# Patient Record
Sex: Female | Born: 1985 | Race: White | Hispanic: No | Marital: Married | Smoking: Never smoker
Health system: Southern US, Community
[De-identification: ages and names within clinical notes are randomized; demographics above are authoritative.]

## PROBLEM LIST (undated history)

## (undated) ENCOUNTER — Inpatient Hospital Stay (HOSPITAL_COMMUNITY): Payer: Self-pay

## (undated) DIAGNOSIS — K5190 Ulcerative colitis, unspecified, without complications: Secondary | ICD-10-CM

## (undated) DIAGNOSIS — O139 Gestational [pregnancy-induced] hypertension without significant proteinuria, unspecified trimester: Secondary | ICD-10-CM

## (undated) DIAGNOSIS — R002 Palpitations: Secondary | ICD-10-CM

## (undated) DIAGNOSIS — F418 Other specified anxiety disorders: Secondary | ICD-10-CM

## (undated) DIAGNOSIS — C801 Malignant (primary) neoplasm, unspecified: Secondary | ICD-10-CM

## (undated) DIAGNOSIS — K219 Gastro-esophageal reflux disease without esophagitis: Secondary | ICD-10-CM

## (undated) HISTORY — DX: Palpitations: R00.2

## (undated) HISTORY — DX: Gestational (pregnancy-induced) hypertension without significant proteinuria, unspecified trimester: O13.9

## (undated) HISTORY — PX: BREAST SURGERY: SHX581

## (undated) HISTORY — PX: BREAST ENHANCEMENT SURGERY: SHX7

## (undated) HISTORY — DX: Ulcerative colitis, unspecified, without complications: K51.90

## (undated) HISTORY — PX: APPENDECTOMY: SHX54

## (undated) HISTORY — DX: Other specified anxiety disorders: F41.8

---

## 2006-02-19 ENCOUNTER — Emergency Department (HOSPITAL_COMMUNITY): Admission: EM | Admit: 2006-02-19 | Discharge: 2006-02-20 | Payer: Self-pay | Admitting: Emergency Medicine

## 2006-04-29 ENCOUNTER — Emergency Department (HOSPITAL_COMMUNITY): Admission: EM | Admit: 2006-04-29 | Discharge: 2006-04-29 | Payer: Self-pay | Admitting: Emergency Medicine

## 2007-10-10 ENCOUNTER — Emergency Department (HOSPITAL_COMMUNITY): Admission: EM | Admit: 2007-10-10 | Discharge: 2007-10-10 | Payer: Self-pay | Admitting: Emergency Medicine

## 2008-02-20 HISTORY — PX: WISDOM TOOTH EXTRACTION: SHX21

## 2009-04-08 ENCOUNTER — Ambulatory Visit: Payer: Self-pay | Admitting: Internal Medicine

## 2009-07-23 ENCOUNTER — Emergency Department (HOSPITAL_COMMUNITY): Admission: EM | Admit: 2009-07-23 | Discharge: 2009-07-23 | Payer: Self-pay | Admitting: Emergency Medicine

## 2011-03-05 ENCOUNTER — Ambulatory Visit (HOSPITAL_COMMUNITY)
Admission: AD | Admit: 2011-03-05 | Discharge: 2011-03-05 | Disposition: A | Discharge status: Home / Self Care | Payer: 59 | Source: Ambulatory Visit | Attending: Obstetrics and Gynecology | Admitting: Obstetrics and Gynecology

## 2012-05-02 ENCOUNTER — Observation Stay (HOSPITAL_COMMUNITY)
Admission: EM | Admit: 2012-05-02 | Discharge: 2012-05-04 | Disposition: A | Discharge status: Home / Self Care | Payer: 59 | Attending: General Surgery | Admitting: General Surgery

## 2012-05-02 ENCOUNTER — Encounter (HOSPITAL_COMMUNITY): Payer: Self-pay | Admitting: *Deleted

## 2012-05-02 DIAGNOSIS — K3580 Unspecified acute appendicitis: Principal | ICD-10-CM | POA: Insufficient documentation

## 2012-05-02 LAB — URINALYSIS, ROUTINE W REFLEX MICROSCOPIC
Bilirubin Urine: NEGATIVE
Hgb urine dipstick: NEGATIVE
Specific Gravity, Urine: 1.01 (ref 1.005–1.030)
pH: 5.5 (ref 5.0–8.0)

## 2012-05-02 LAB — BASIC METABOLIC PANEL
BUN: 8 mg/dL (ref 6–23)
Chloride: 99 mEq/L (ref 96–112)
Glucose, Bld: 90 mg/dL (ref 70–99)
Potassium: 4.1 mEq/L (ref 3.5–5.1)

## 2012-05-02 LAB — CBC WITH DIFFERENTIAL/PLATELET
Basophils Relative: 0 % (ref 0–1)
HCT: 40.6 % (ref 36.0–46.0)
Hemoglobin: 13.5 g/dL (ref 12.0–15.0)
MCH: 27 pg (ref 26.0–34.0)
MCHC: 33.3 g/dL (ref 30.0–36.0)
Monocytes Absolute: 1.3 10*3/uL — ABNORMAL HIGH (ref 0.1–1.0)
Monocytes Relative: 7 % (ref 3–12)
Neutro Abs: 14 10*3/uL — ABNORMAL HIGH (ref 1.7–7.7)

## 2012-05-02 LAB — URINE MICROSCOPIC-ADD ON

## 2012-05-02 MED ORDER — IOHEXOL 300 MG/ML  SOLN: 50.0000 mL | Freq: Once | INTRAMUSCULAR | Status: AC | PRN

## 2012-05-02 NOTE — ED Notes (Signed)
Pt given heat pack to place on abdomen.

## 2012-05-02 NOTE — ED Notes (Signed)
C/o RLQ pain, constant, worse with movement, onset this am 1030, some intermittant chills, dizziness and nausea, (denies: vd, fever, bleeding, back pain, urinary or vaginal sx). Last void 4 hrs ago, last ate 1200 noon, last BM 2 d ago (normal), LMP 1.5 wks ago, "no chance of pregnancy". Tried tylenol 500 mg at 1900 and pepto bismol at 1600 w/o relief.

## 2012-05-02 NOTE — ED Notes (Signed)
Pt currently denies nausea, states she is still having dull pain in the RLQ.

## 2012-05-03 ENCOUNTER — Emergency Department (HOSPITAL_COMMUNITY): Payer: 59 | Admitting: Anesthesiology

## 2012-05-03 ENCOUNTER — Encounter (HOSPITAL_COMMUNITY): Payer: Self-pay | Admitting: *Deleted

## 2012-05-03 ENCOUNTER — Encounter (HOSPITAL_COMMUNITY)
Admission: EM | Disposition: A | Discharge status: Home / Self Care | Payer: Self-pay | Source: Home / Self Care | Attending: Emergency Medicine

## 2012-05-03 ENCOUNTER — Emergency Department (HOSPITAL_COMMUNITY): Payer: 59

## 2012-05-03 ENCOUNTER — Encounter (HOSPITAL_COMMUNITY): Payer: Self-pay | Admitting: Anesthesiology

## 2012-05-03 ENCOUNTER — Encounter (HOSPITAL_COMMUNITY): Payer: Self-pay | Admitting: General Surgery

## 2012-05-03 DIAGNOSIS — K3580 Unspecified acute appendicitis: Secondary | ICD-10-CM

## 2012-05-03 HISTORY — PX: LAPAROSCOPIC APPENDECTOMY: SHX408

## 2012-05-03 SURGERY — APPENDECTOMY, LAPAROSCOPIC
Anesthesia: General | Site: Abdomen | Wound class: Contaminated

## 2012-05-03 MED ORDER — SODIUM CHLORIDE 0.9 % IR SOLN
Status: DC | PRN
  Administered 2012-05-03: 1

## 2012-05-03 MED ORDER — METRONIDAZOLE IN NACL 5-0.79 MG/ML-% IV SOLN
INTRAVENOUS | Status: DC | PRN
  Administered 2012-05-03: 500 mg via INTRAVENOUS

## 2012-05-03 MED ORDER — KETOROLAC TROMETHAMINE 30 MG/ML IJ SOLN
30.0000 mg | Freq: Once | INTRAMUSCULAR | Status: AC
  Administered 2012-05-03: 30 mg via INTRAVENOUS

## 2012-05-03 MED ORDER — GLYCOPYRROLATE 0.2 MG/ML IJ SOLN
INTRAMUSCULAR | Status: DC | PRN
  Administered 2012-05-03: 0.4 mg via INTRAVENOUS

## 2012-05-03 MED ORDER — KETOROLAC TROMETHAMINE 15 MG/ML IJ SOLN
15.0000 mg | Freq: Four times a day (QID) | INTRAMUSCULAR | Status: DC
  Administered 2012-05-03 – 2012-05-04 (×5): 15 mg via INTRAVENOUS
  Filled 2012-05-03 (×8): qty 1

## 2012-05-03 MED ORDER — HYDROMORPHONE HCL PF 1 MG/ML IJ SOLN
INTRAMUSCULAR | Status: AC
  Filled 2012-05-03: qty 1

## 2012-05-03 MED ORDER — ACETAMINOPHEN 325 MG PO TABS: 650.0000 mg | ORAL_TABLET | ORAL | Status: DC | PRN

## 2012-05-03 MED ORDER — ENOXAPARIN SODIUM 40 MG/0.4ML ~~LOC~~ SOLN
40.0000 mg | SUBCUTANEOUS | Status: DC
  Administered 2012-05-03: 40 mg via SUBCUTANEOUS
  Filled 2012-05-03 (×2): qty 0.4

## 2012-05-03 MED ORDER — HYDROMORPHONE HCL PF 1 MG/ML IJ SOLN
0.2500 mg | INTRAMUSCULAR | Status: DC | PRN
  Administered 2012-05-03 (×4): 0.5 mg via INTRAVENOUS

## 2012-05-03 MED ORDER — OXYCODONE-ACETAMINOPHEN 5-325 MG PO TABS: 1.0000 | ORAL_TABLET | ORAL | Status: DC | PRN

## 2012-05-03 MED ORDER — METOCLOPRAMIDE HCL 5 MG/ML IJ SOLN
INTRAMUSCULAR | Status: DC | PRN
  Administered 2012-05-03: 10 mg via INTRAVENOUS

## 2012-05-03 MED ORDER — ONDANSETRON HCL 4 MG PO TABS: 4.0000 mg | ORAL_TABLET | Freq: Four times a day (QID) | ORAL | Status: DC | PRN

## 2012-05-03 MED ORDER — BUPIVACAINE-EPINEPHRINE 0.25% -1:200000 IJ SOLN: INTRAMUSCULAR | Status: DC | PRN

## 2012-05-03 MED ORDER — BUPIVACAINE HCL 0.25 % IJ SOLN
INTRAMUSCULAR | Status: DC | PRN
  Administered 2012-05-03: 18 mL

## 2012-05-03 MED ORDER — CIPROFLOXACIN IN D5W 400 MG/200ML IV SOLN
400.0000 mg | Freq: Two times a day (BID) | INTRAVENOUS | Status: AC
  Administered 2012-05-03: 400 mg via INTRAVENOUS
  Filled 2012-05-03: qty 200

## 2012-05-03 MED ORDER — ARTIFICIAL TEARS OP OINT
TOPICAL_OINTMENT | OPHTHALMIC | Status: DC | PRN
  Administered 2012-05-03: 1 via OPHTHALMIC

## 2012-05-03 MED ORDER — NEOSTIGMINE METHYLSULFATE 1 MG/ML IJ SOLN
INTRAMUSCULAR | Status: DC | PRN
  Administered 2012-05-03: 4 mg via INTRAVENOUS

## 2012-05-03 MED ORDER — DEXAMETHASONE SODIUM PHOSPHATE 4 MG/ML IJ SOLN
INTRAMUSCULAR | Status: DC | PRN
  Administered 2012-05-03: 8 mg via INTRAVENOUS

## 2012-05-03 MED ORDER — ACETAMINOPHEN 10 MG/ML IV SOLN
1000.0000 mg | Freq: Once | INTRAVENOUS | Status: AC | PRN
  Administered 2012-05-03: 1000 mg via INTRAVENOUS

## 2012-05-03 MED ORDER — LIDOCAINE HCL (CARDIAC) 20 MG/ML IV SOLN
INTRAVENOUS | Status: DC | PRN
  Administered 2012-05-03: 40 mg via INTRAVENOUS
  Administered 2012-05-03: 60 mg via INTRAVENOUS

## 2012-05-03 MED ORDER — HYDROMORPHONE HCL PF 1 MG/ML IJ SOLN: 0.5000 mg | INTRAMUSCULAR | Status: DC | PRN

## 2012-05-03 MED ORDER — CIPROFLOXACIN IN D5W 400 MG/200ML IV SOLN
400.0000 mg | INTRAVENOUS | Status: AC
  Administered 2012-05-03: 400 mg via INTRAVENOUS
  Filled 2012-05-03: qty 200

## 2012-05-03 MED ORDER — EPHEDRINE SULFATE 50 MG/ML IJ SOLN
INTRAMUSCULAR | Status: DC | PRN
  Administered 2012-05-03: 5 mg via INTRAVENOUS

## 2012-05-03 MED ORDER — ONDANSETRON HCL 4 MG/2ML IJ SOLN: 4.0000 mg | Freq: Once | INTRAMUSCULAR | Status: DC | PRN

## 2012-05-03 MED ORDER — METRONIDAZOLE IN NACL 5-0.79 MG/ML-% IV SOLN
500.0000 mg | INTRAVENOUS | Status: AC
  Administered 2012-05-03: 500 mg via INTRAVENOUS
  Filled 2012-05-03: qty 100

## 2012-05-03 MED ORDER — KETOROLAC TROMETHAMINE 30 MG/ML IJ SOLN
INTRAMUSCULAR | Status: AC
  Filled 2012-05-03: qty 1

## 2012-05-03 MED ORDER — IOHEXOL 300 MG/ML  SOLN
100.0000 mL | Freq: Once | INTRAMUSCULAR | Status: AC | PRN
  Administered 2012-05-03: 100 mL via INTRAVENOUS

## 2012-05-03 MED ORDER — ONDANSETRON HCL 4 MG/2ML IJ SOLN
INTRAMUSCULAR | Status: DC | PRN
  Administered 2012-05-03: 4 mg via INTRAVENOUS

## 2012-05-03 MED ORDER — SUCCINYLCHOLINE CHLORIDE 20 MG/ML IJ SOLN
INTRAMUSCULAR | Status: DC | PRN
  Administered 2012-05-03: 120 mg via INTRAVENOUS

## 2012-05-03 MED ORDER — KCL IN DEXTROSE-NACL 20-5-0.45 MEQ/L-%-% IV SOLN
INTRAVENOUS | Status: DC
  Administered 2012-05-03: 1000 mL via INTRAVENOUS
  Administered 2012-05-03 – 2012-05-04 (×2): via INTRAVENOUS
  Filled 2012-05-03 (×5): qty 1000

## 2012-05-03 MED ORDER — DEXTROSE 5 % IV SOLN
INTRAVENOUS | Status: DC | PRN
  Administered 2012-05-03: 03:00:00 via INTRAVENOUS

## 2012-05-03 MED ORDER — METRONIDAZOLE IN NACL 5-0.79 MG/ML-% IV SOLN
500.0000 mg | Freq: Three times a day (TID) | INTRAVENOUS | Status: AC
  Administered 2012-05-03: 500 mg via INTRAVENOUS
  Filled 2012-05-03: qty 100

## 2012-05-03 MED ORDER — MIDAZOLAM HCL 5 MG/5ML IJ SOLN
INTRAMUSCULAR | Status: DC | PRN
  Administered 2012-05-03: 2 mg via INTRAVENOUS

## 2012-05-03 MED ORDER — LACTATED RINGERS IV SOLN
INTRAVENOUS | Status: DC | PRN
  Administered 2012-05-03: 02:00:00 via INTRAVENOUS

## 2012-05-03 MED ORDER — VECURONIUM BROMIDE 10 MG IV SOLR
INTRAVENOUS | Status: DC | PRN
  Administered 2012-05-03: 4 mg via INTRAVENOUS

## 2012-05-03 MED ORDER — ONDANSETRON HCL 4 MG/2ML IJ SOLN: 4.0000 mg | Freq: Four times a day (QID) | INTRAMUSCULAR | Status: DC | PRN

## 2012-05-03 MED ORDER — FENTANYL CITRATE 0.05 MG/ML IJ SOLN
INTRAMUSCULAR | Status: DC | PRN
  Administered 2012-05-03 (×2): 100 ug via INTRAVENOUS
  Administered 2012-05-03 (×2): 50 ug via INTRAVENOUS

## 2012-05-03 MED ORDER — DESOGESTREL-ETHINYL ESTRADIOL 0.15-0.02/0.01 MG (21/5) PO TABS: 1.0000 | ORAL_TABLET | Freq: Every day | ORAL | Status: DC

## 2012-05-03 MED ORDER — ACETAMINOPHEN 10 MG/ML IV SOLN
INTRAVENOUS | Status: AC
  Filled 2012-05-03: qty 100

## 2012-05-03 MED ORDER — PROPOFOL 10 MG/ML IV BOLUS
INTRAVENOUS | Status: DC | PRN
  Administered 2012-05-03: 140 mg via INTRAVENOUS

## 2012-05-03 SURGICAL SUPPLY — 47 items
APPLIER CLIP ROT 10 11.4 M/L (STAPLE)
BENZOIN TINCTURE PRP APPL 2/3 (GAUZE/BANDAGES/DRESSINGS) ×2 IMPLANT
BLADE SURG ROTATE 9660 (MISCELLANEOUS) IMPLANT
CANISTER SUCTION 2500CC (MISCELLANEOUS) ×2 IMPLANT
CHLORAPREP W/TINT 26ML (MISCELLANEOUS) ×2 IMPLANT
CLIP APPLIE ROT 10 11.4 M/L (STAPLE) IMPLANT
CLOTH BEACON ORANGE TIMEOUT ST (SAFETY) ×2 IMPLANT
CLSR STERI-STRIP ANTIMIC 1/2X4 (GAUZE/BANDAGES/DRESSINGS) ×2 IMPLANT
COVER SURGICAL LIGHT HANDLE (MISCELLANEOUS) ×2 IMPLANT
CUTTER LINEAR ENDO 35 ETS (STAPLE) ×2 IMPLANT
CUTTER LINEAR ENDO 35 ETS TH (STAPLE) IMPLANT
DECANTER SPIKE VIAL GLASS SM (MISCELLANEOUS) ×2 IMPLANT
DERMABOND ADHESIVE PROPEN (GAUZE/BANDAGES/DRESSINGS) ×1
DERMABOND ADVANCED (GAUZE/BANDAGES/DRESSINGS) ×1
DERMABOND ADVANCED .7 DNX12 (GAUZE/BANDAGES/DRESSINGS) ×1 IMPLANT
DERMABOND ADVANCED .7 DNX6 (GAUZE/BANDAGES/DRESSINGS) ×1 IMPLANT
DRAPE UTILITY 15X26 W/TAPE STR (DRAPE) ×4 IMPLANT
ELECT REM PT RETURN 9FT ADLT (ELECTROSURGICAL) ×2
ELECTRODE REM PT RTRN 9FT ADLT (ELECTROSURGICAL) ×1 IMPLANT
ENDOLOOP SUT PDS II  0 18 (SUTURE)
ENDOLOOP SUT PDS II 0 18 (SUTURE) IMPLANT
GLOVE BIO SURGEON STRL SZ8 (GLOVE) ×4 IMPLANT
GLOVE BIOGEL PI IND STRL 8 (GLOVE) ×3 IMPLANT
GLOVE BIOGEL PI INDICATOR 8 (GLOVE) ×3
GLOVE ECLIPSE 7.5 STRL STRAW (GLOVE) ×2 IMPLANT
GOWN EXTRA PROTECTION XL (GOWNS) ×2 IMPLANT
GOWN STRL NON-REIN LRG LVL3 (GOWN DISPOSABLE) IMPLANT
GOWN STRL REIN 3XL LVL4 (GOWN DISPOSABLE) ×4 IMPLANT
KIT BASIN OR (CUSTOM PROCEDURE TRAY) ×2 IMPLANT
KIT ROOM TURNOVER OR (KITS) ×2 IMPLANT
NS IRRIG 1000ML POUR BTL (IV SOLUTION) ×2 IMPLANT
PAD ARMBOARD 7.5X6 YLW CONV (MISCELLANEOUS) ×4 IMPLANT
PENCIL BUTTON HOLSTER BLD 10FT (ELECTRODE) IMPLANT
POUCH SPECIMEN RETRIEVAL 10MM (ENDOMECHANICALS) ×2 IMPLANT
RELOAD /EVU35 (ENDOMECHANICALS) ×2 IMPLANT
RELOAD CUTTER ETS 35MM STAND (ENDOMECHANICALS) IMPLANT
SET IRRIG TUBING LAPAROSCOPIC (IRRIGATION / IRRIGATOR) ×2 IMPLANT
SPECIMEN JAR SMALL (MISCELLANEOUS) ×2 IMPLANT
SUT MNCRL AB 4-0 PS2 18 (SUTURE) ×2 IMPLANT
TOWEL OR 17X24 6PK STRL BLUE (TOWEL DISPOSABLE) ×2 IMPLANT
TOWEL OR 17X26 10 PK STRL BLUE (TOWEL DISPOSABLE) ×2 IMPLANT
TRAY FOLEY CATH 14FR (SET/KITS/TRAYS/PACK) ×2 IMPLANT
TRAY LAPAROSCOPIC (CUSTOM PROCEDURE TRAY) ×2 IMPLANT
TROCAR XCEL 12X100 BLDLESS (ENDOMECHANICALS) ×2 IMPLANT
TROCAR XCEL BLUNT TIP 100MML (ENDOMECHANICALS) ×2 IMPLANT
TROCAR XCEL NON-BLD 5MMX100MML (ENDOMECHANICALS) ×2 IMPLANT
WATER STERILE IRR 1000ML POUR (IV SOLUTION) IMPLANT

## 2012-05-03 NOTE — Anesthesia Procedure Notes (Signed)
Procedure Name: Intubation Date/Time: 05/03/2012 2:38 AM Performed by: Wray Kearns A Pre-anesthesia Checklist: Patient identified, Timeout performed, Emergency Drugs available, Suction available and Patient being monitored Patient Re-evaluated:Patient Re-evaluated prior to inductionOxygen Delivery Method: Circle system utilized Intubation Type: IV induction, Rapid sequence and Cricoid Pressure applied Laryngoscope Size: Mac and 3 Grade View: Grade I Tube type: Oral Tube size: 8.0 mm Number of attempts: 1 Airway Equipment and Method: Stylet Placement Confirmation: ETT inserted through vocal cords under direct vision,  positive ETCO2,  breath sounds checked- equal and bilateral and CO2 detector Secured at: 22 cm Tube secured with: Tape Dental Injury: Teeth and Oropharynx as per pre-operative assessment

## 2012-05-03 NOTE — Anesthesia Postprocedure Evaluation (Signed)
  Anesthesia Post-op Note  Patient: Madeline Perkins  Procedure(s) Performed: Procedure(s) (LRB) with comments: APPENDECTOMY LAPAROSCOPIC (N/A)  Patient Location: PACU  Anesthesia Type:General  Level of Consciousness: awake, alert  and oriented  Airway and Oxygen Therapy: Patient Spontanous Breathing and Patient connected to nasal cannula oxygen  Post-op Pain: mild  Post-op Assessment: Post-op Vital signs reviewed, Patient's Cardiovascular Status Stable, Respiratory Function Stable, Patent Airway, No signs of Nausea or vomiting and Pain level controlled  Post-op Vital Signs: stable  Complications: No apparent anesthesia complications

## 2012-05-03 NOTE — ED Provider Notes (Signed)
History     CSN: [id number]  Arrival date & time 05/02/12  2039   First MD Initiated Contact with Patient 05/02/12 2237      Chief Complaint  Patient presents with  . Abdominal Pain    (Consider location/radiation/quality/duration/timing/severity/associated sxs/prior treatment) Patient is a 27 y.o. female presenting with abdominal pain. The history is provided by the patient.  Abdominal Pain The primary symptoms of the illness include abdominal pain and nausea. The primary symptoms of the illness do not include fever, fatigue, shortness of breath, vomiting, diarrhea, hematemesis, hematochezia, dysuria, vaginal discharge or vaginal bleeding. Primary symptoms comment: Pt complains of right lower quadrant pain starting this morning The current episode started 13 to 24 hours ago. The onset of the illness was sudden. The problem has not changed since onset. The abdominal pain began 13 to24 hours ago. The pain came on suddenly. The abdominal pain has been unchanged since its onset. The abdominal pain is located in the RLQ. The abdominal pain does not radiate. The severity of the abdominal pain is 5/10. The abdominal pain is relieved by being still. The abdominal pain is exacerbated by deep breathing and movement.  Nausea began today. Associated with: nausea began today with the pain.  The patient states that she believes she is currently not pregnant. The patient has not had a change in bowel habit. Symptoms associated with the illness do not include chills, anorexia, diaphoresis, heartburn, constipation, urgency, hematuria, frequency or back pain. Significant associated medical issues do not include PUD, GERD, inflammatory bowel disease, diabetes, sickle cell disease, gallstones, liver disease, substance abuse, diverticulitis, HIV or cardiac disease. Associated medical issues comments: Pt is otherwise healthy.  No prior abdominal surgeries.    History reviewed. No pertinent past medical  history.  Past Surgical History  Procedure Date  . Wisdom tooth extraction     No family history on file.  History  Substance Use Topics  . Smoking status: Never Smoker   . Smokeless tobacco: Not on file  . Alcohol Use: No    OB History    Grav Para Term Preterm Abortions TAB SAB Ect Mult Living                  Review of Systems  Constitutional: Negative for fever, chills, diaphoresis and fatigue.  Eyes: Negative.   Respiratory: Negative.  Negative for shortness of breath.   Cardiovascular: Negative.   Gastrointestinal: Positive for nausea and abdominal pain. Negative for heartburn, vomiting, diarrhea, constipation, blood in stool, hematochezia, abdominal distention, anorexia and hematemesis.  Genitourinary: Negative for dysuria, urgency, frequency, hematuria, flank pain, decreased urine volume, vaginal bleeding, vaginal discharge, difficulty urinating, vaginal pain and pelvic pain.  Musculoskeletal: Negative for myalgias, back pain and arthralgias.  Skin: Negative for rash and wound.  Neurological: Positive for headaches. Negative for dizziness, syncope, facial asymmetry, speech difficulty, weakness and light-headedness.       Pt states that she had a slight headache earlier today  Psychiatric/Behavioral: Negative.     Allergies  Penicillins  Home Medications   Current Outpatient Rx  Name  Route  Sig  Dispense  Refill  . ACETAMINOPHEN 500 MG PO TABS   Oral   Take 500 mg by mouth every 6 (six) hours as needed. For pain         . BISMUTH SUBSALICYLATE 262 MG/15ML PO SUSP   Oral   Take 15 mLs by mouth every 6 (six) hours as needed. For upset stomach         .  DESOGESTREL-ETHINYL ESTRADIOL 0.15-0.02/0.01 MG (21/5) PO TABS   Oral   Take 1 tablet by mouth daily.           BP 128/78  Pulse 85  Temp 98.7 F (37.1 C) (Oral)  Resp 20  SpO2 100%  LMP 04/21/2012  Physical Exam  Nursing note and vitals reviewed. Constitutional: She is oriented to person,  place, and time. She appears well-developed and well-nourished. No distress.  HENT:  Head: Normocephalic and atraumatic.  Right Ear: External ear normal.  Left Ear: External ear normal.  Nose: Nose normal.  Eyes: Conjunctivae normal and EOM are normal. Pupils are equal, round, and reactive to light. Right eye exhibits no discharge. Left eye exhibits no discharge. No scleral icterus.  Neck: Normal range of motion.  Cardiovascular: Normal rate, regular rhythm and normal heart sounds.  Exam reveals no gallop and no friction rub.   No murmur heard. Pulmonary/Chest: Effort normal and breath sounds normal. No stridor. No respiratory distress. She has no wheezes. She has no rales. She exhibits no tenderness.  Abdominal: Soft. Normal appearance and bowel sounds are normal. She exhibits no distension and no mass. There is tenderness in the right lower quadrant. There is rebound, guarding and tenderness at McBurney's point. There is no rigidity.       Positive Rovsing's Sign Positive Psoas sign  Genitourinary: Vagina normal. Cervix exhibits no motion tenderness. Right adnexum displays no mass, no tenderness and no fullness. Left adnexum displays no mass, no tenderness and no fullness.  Musculoskeletal: She exhibits no edema.  Neurological: She is alert and oriented to person, place, and time. No cranial nerve deficit.  Skin: Skin is warm and dry. No rash noted. She is not diaphoretic. No erythema.  Psychiatric: She has a normal mood and affect. Her behavior is normal.    ED Course  Procedures (including critical care time)  Labs Reviewed  URINALYSIS, ROUTINE W REFLEX MICROSCOPIC - Abnormal; Notable for the following:    Leukocytes, UA SMALL (*)     All other components within normal limits  BASIC METABOLIC PANEL - Abnormal; Notable for the following:    Sodium 134 (*)     All other components within normal limits  CBC WITH DIFFERENTIAL - Abnormal; Notable for the following:    WBC 17.7 (*)      Neutrophils Relative 79 (*)     Neutro Abs 14.0 (*)     Monocytes Absolute 1.3 (*)     All other components within normal limits  URINE MICROSCOPIC-ADD ON - Abnormal; Notable for the following:    Squamous Epithelial / LPF FEW (*)     All other components within normal limits  POCT PREGNANCY, URINE   Ct Abdomen Pelvis W Contrast  05/03/2012  *RADIOLOGY REPORT*  Clinical Data: Right lower abdominal pain  CT ABDOMEN AND PELVIS WITH CONTRAST  Technique:  Multidetector CT imaging of the abdomen and pelvis was performed following the standard protocol during bolus administration of intravenous contrast.  Contrast: OMNIPAQUE IOHEXOL 300 MG/ML  SOLN  Comparison: None.  Findings: Bilateral breast implants are partially seen.  Visualized lung bases clear.  Unremarkable liver, gallbladder, spleen, adrenal glands, kidneys, pancreas, abdominal aorta, portal vein.  Stomach, small bowel, and colon are nondilated.  The appendix is thick-walled, dilated to a diameter of 11 mm, with some adjacent inflammatory/edematous changes.  No evidence of perforation or abscess.  3.8 cm left adnexal cyst.  Uterus and right adnexal region unremarkable.  No free fluid.  Urinary bladder incompletely distended.  Regional bones unremarkable.  IMPRESSION:  1.  Acute appendicitis without perforation or abscess. I telephoned the critical test results to Magnus Sinning at the time of interpretation. 2.  Left ovarian cyst, probably physiologic.   Original Report Authenticated By: D. Andria Rhein, MD      No diagnosis found.  1:06 AM Discussed patient with Dr. Lindie Spruce with General Surgery.    MDM  Patient presented to the ED with RLQ abdominal pain that began earlier today.  Pain associated with some nausea, but no vomiting.  On exam patient tender to palpation in the RLQ with rebound and guarding.  No adnexal tenderness.  CBC shows elevated WBC of 17.7.  CT ordered to rule out an Acute Appendicitis.  CT showing Acute  Appendicitis.  Discussed with Dr. Lindie Spruce with General Surgery who has agreed to admit patient for surgery.        Pascal Lux Seacliff, PA-C 05/03/12 1655

## 2012-05-03 NOTE — H&P (Signed)
Madeline Perkins is an 27 y.o. female.   Chief Complaint: Abdominal pain HPI: the patient has been having abdominal pain since 10AM associated with nausea but no vomiting.  No fevers or chills.  CT dx acute appendicitis  History reviewed. No pertinent past medical history.  Past Surgical History  Procedure Date  . Wisdom tooth extraction     No family history on file. Social History:  reports that she has never smoked. She does not have any smokeless tobacco history on file. She reports that she does not drink alcohol or use illicit drugs.  Allergies:  Allergies  Allergen Reactions  . Penicillins      (Not in a hospital admission)  Results for orders placed during the hospital encounter of 05/02/12 (from the past 48 hour(s))  BASIC METABOLIC PANEL     Status: Abnormal   Collection Time   05/02/12  9:24 PM      Component Value Range Comment   Sodium 134 (*) 135 - 145 mEq/L    Potassium 4.1  3.5 - 5.1 mEq/L    Chloride 99  96 - 112 mEq/L    CO2 26  19 - 32 mEq/L    Glucose, Bld 90  70 - 99 mg/dL    BUN 8  6 - 23 mg/dL    Creatinine, Ser 0.86  0.50 - 1.10 mg/dL    Calcium 10.1  8.4 - 10.5 mg/dL    GFR calc non Af Amer >90  >90 mL/min    GFR calc Af Amer >90  >90 mL/min   CBC WITH DIFFERENTIAL     Status: Abnormal   Collection Time   05/02/12  9:24 PM      Component Value Range Comment   WBC 17.7 (*) 4.0 - 10.5 K/uL    RBC 5.00  3.87 - 5.11 MIL/uL    Hemoglobin 13.5  12.0 - 15.0 g/dL    HCT 40.6  36.0 - 46.0 %    MCV 81.2  78.0 - 100.0 fL    MCH 27.0  26.0 - 34.0 pg    MCHC 33.3  30.0 - 36.0 g/dL    RDW 13.2  11.5 - 15.5 %    Platelets 280  150 - 400 K/uL    Neutrophils Relative 79 (*) 43 - 77 %    Neutro Abs 14.0 (*) 1.7 - 7.7 K/uL    Lymphocytes Relative 13  12 - 46 %    Lymphs Abs 2.3  0.7 - 4.0 K/uL    Monocytes Relative 7  3 - 12 %    Monocytes Absolute 1.3 (*) 0.1 - 1.0 K/uL    Eosinophils Relative 1  0 - 5 %    Eosinophils Absolute 0.1  0.0 - 0.7 K/uL    Basophils Relative 0  0 - 1 %    Basophils Absolute 0.0  0.0 - 0.1 K/uL   URINALYSIS, ROUTINE W REFLEX MICROSCOPIC     Status: Abnormal   Collection Time   05/02/12  9:47 PM      Component Value Range Comment   Color, Urine YELLOW  YELLOW    APPearance CLEAR  CLEAR    Specific Gravity, Urine 1.010  1.005 - 1.030    pH 5.5  5.0 - 8.0    Glucose, UA NEGATIVE  NEGATIVE mg/dL    Hgb urine dipstick NEGATIVE  NEGATIVE    Bilirubin Urine NEGATIVE  NEGATIVE    Ketones, ur NEGATIVE  NEGATIVE mg/dL  Protein, ur NEGATIVE  NEGATIVE mg/dL    Urobilinogen, UA 0.2  0.0 - 1.0 mg/dL    Nitrite NEGATIVE  NEGATIVE    Leukocytes, UA SMALL (*) NEGATIVE   URINE MICROSCOPIC-ADD ON     Status: Abnormal   Collection Time   05/02/12  9:47 PM      Component Value Range Comment   Squamous Epithelial / LPF FEW (*) RARE    WBC, UA 0-2  <3 WBC/hpf    Bacteria, UA RARE  RARE   POCT PREGNANCY, URINE     Status: Normal   Collection Time   05/02/12 10:04 PM      Component Value Range Comment   Preg Test, Ur NEGATIVE  NEGATIVE    Ct Abdomen Pelvis W Contrast  05/03/2012  *RADIOLOGY REPORT*  Clinical Data: Right lower abdominal pain  CT ABDOMEN AND PELVIS WITH CONTRAST  Technique:  Multidetector CT imaging of the abdomen and pelvis was performed following the standard protocol during bolus administration of intravenous contrast.  Contrast: 117m OMNIPAQUE IOHEXOL 300 MG/ML  SOLN  Comparison: None.  Findings: Bilateral breast implants are partially seen.  Visualized lung bases clear.  Unremarkable liver, gallbladder, spleen, adrenal glands, kidneys, pancreas, abdominal aorta, portal vein.  Stomach, small bowel, and colon are nondilated.  The appendix is thick-walled, dilated to a diameter of 11 mm, with some adjacent inflammatory/edematous changes.  No evidence of perforation or abscess.  3.8 cm left adnexal cyst.  Uterus and right adnexal region unremarkable.  No free fluid.  Urinary bladder incompletely distended.   Regional bones unremarkable.  IMPRESSION:  1.  Acute appendicitis without perforation or abscess. I telephoned the critical test results to HTeofilo Podat the time of interpretation. 2.  Left ovarian cyst, probably physiologic.   Original Report Authenticated By: D. HWallace Going MD     Review of Systems  Constitutional: Negative for fever and chills.  HENT: Negative.   Respiratory: Negative.   Cardiovascular: Negative.   Gastrointestinal: Positive for nausea and abdominal pain. Negative for vomiting.  Genitourinary: Negative.   Musculoskeletal: Negative.   Skin: Negative.   Neurological: Negative.   Endo/Heme/Allergies: Negative.   Psychiatric/Behavioral: Negative.     Blood pressure 128/78, pulse 85, temperature 98.7 F (37.1 C), temperature source Oral, resp. rate 20, last menstrual period 04/21/2012, SpO2 100.00%. Physical Exam  Constitutional: She appears well-developed and well-nourished.  HENT:  Head: Normocephalic and atraumatic.  Eyes: Conjunctivae normal and EOM are normal. Pupils are equal, round, and reactive to light.  Neck: Normal range of motion. Neck supple.  GI: Soft. Normal appearance and bowel sounds are normal. There is tenderness in the right lower quadrant. There is rebound (positive Rovsing's sign), guarding and tenderness at McBurney's point.     Assessment/Plan Acute appendicitis  IV Cipro and flagyl OR for Lap Appy.  WGwenyth Ober1/13/2014, 1:23 AM

## 2012-05-03 NOTE — Transfer of Care (Signed)
Immediate Anesthesia Transfer of Care Note  Patient: Madeline Perkins  Procedure(s) Performed: Procedure(s) (LRB) with comments: APPENDECTOMY LAPAROSCOPIC (N/A)  Patient Location: PACU  Anesthesia Type:General  Level of Consciousness: oriented, sedated, patient cooperative and responds to stimulation  Airway & Oxygen Therapy: Patient Spontanous Breathing and Patient connected to nasal cannula oxygen  Post-op Assessment: Report given to PACU RN, Post -op Vital signs reviewed and stable, Patient moving all extremities and Patient moving all extremities X 4  Post vital signs: Reviewed and stable  Complications: No apparent anesthesia complications

## 2012-05-03 NOTE — Anesthesia Preprocedure Evaluation (Signed)
Anesthesia Evaluation  Patient identified by MRN, date of birth, ID band Patient awake    Reviewed: Allergy & Precautions, H&P , NPO status , Patient's Chart, lab work & pertinent test results  Airway Mallampati: II TM Distance: >3 FB Neck ROM: Full    Dental  (+) Teeth Intact   Pulmonary  breath sounds clear to auscultation        Cardiovascular Rhythm:Regular     Neuro/Psych    GI/Hepatic   Endo/Other    Renal/GU      Musculoskeletal   Abdominal   Peds  Hematology   Anesthesia Other Findings   Reproductive/Obstetrics                           Anesthesia Physical Anesthesia Plan  ASA: II and emergent  Anesthesia Plan: General   Post-op Pain Management:    Induction: Intravenous  Airway Management Planned: Oral ETT  Additional Equipment:   Intra-op Plan:   Post-operative Plan:   Informed Consent: I have reviewed the patients History and Physical, chart, labs and discussed the procedure including the risks, benefits and alternatives for the proposed anesthesia with the patient or authorized representative who has indicated his/her understanding and acceptance.     Plan Discussed with: CRNA and Surgeon  Anesthesia Plan Comments: (Acute appendicitis  Plan GA with RSI  Kipp Brood, MD)        Anesthesia Quick Evaluation

## 2012-05-03 NOTE — ED Notes (Signed)
OR ready for PT.

## 2012-05-03 NOTE — Progress Notes (Signed)
Patient ID: Madeline Perkins, female   DOB: March 01, 1986, 27 y.o.   MRN: [id number] Day of Surgery  Subjective: Pt just out of surgery.  Feels ok, just sore.  Has only drank sprite  Objective: Vital signs in last 24 hours: Temp:  [97.5 F (36.4 C)-98.7 F (37.1 C)] 98.5 F (36.9 C) (01/13 0533) Pulse Rate:  [71-103] 87  (01/13 0533) Resp:  [12-20] 18  (01/13 0533) BP: (113-128)/(67-87) 113/67 mmHg (01/13 0533) SpO2:  [99 %-100 %] 100 % (01/13 0533) Weight:  [150 lb (68.04 kg)] 150 lb (68.04 kg) (01/13 0502) Last BM Date: 04/30/12  Intake/Output from previous day: 01/12 0701 - 01/13 0700 In: 1200 [I.V.:1000; IV Piggyback:200] Out: 500 [Urine:500] Intake/Output this shift:    PE: Abd: soft, appropriately tender, incisions c/d/i with steris and tegaderm  Lab Results:   Basename 05/02/12 2124  WBC 17.7*  HGB 13.5  HCT 40.6  PLT 280   BMET  Basename 05/02/12 2124  NA 134*  K 4.1  CL 99  CO2 26  GLUCOSE 90  BUN 8  CREATININE 0.86  CALCIUM 10.1   PT/INR No results found for this basename: LABPROT:2,INR:2 in the last 72 hours CMP     Component Value Date/Time   NA 134* 05/02/2012 2124   K 4.1 05/02/2012 2124   CL 99 05/02/2012 2124   CO2 26 05/02/2012 2124   GLUCOSE 90 05/02/2012 2124   BUN 8 05/02/2012 2124   CREATININE 0.86 05/02/2012 2124   CALCIUM 10.1 05/02/2012 2124   GFRNONAA >90 05/02/2012 2124   GFRAA >90 05/02/2012 2124   Lipase  No results found for this basename: lipase       Studies/Results: Ct Abdomen Pelvis W Contrast  05/03/2012  *RADIOLOGY REPORT*  Clinical Data: Right lower abdominal pain  CT ABDOMEN AND PELVIS WITH CONTRAST  Technique:  Multidetector CT imaging of the abdomen and pelvis was performed following the standard protocol during bolus administration of intravenous contrast.  Contrast: OMNIPAQUE IOHEXOL 300 MG/ML  SOLN  Comparison: None.  Findings: Bilateral breast implants are partially seen.  Visualized lung bases clear.   Unremarkable liver, gallbladder, spleen, adrenal glands, kidneys, pancreas, abdominal aorta, portal vein.  Stomach, small bowel, and colon are nondilated.  The appendix is thick-walled, dilated to a diameter of 11 mm, with some adjacent inflammatory/edematous changes.  No evidence of perforation or abscess.  3.8 cm left adnexal cyst.  Uterus and right adnexal region unremarkable.  No free fluid.  Urinary bladder incompletely distended.  Regional bones unremarkable.  IMPRESSION:  1.  Acute appendicitis without perforation or abscess. I telephoned the critical test results to Magnus Sinning at the time of interpretation. 2.  Left ovarian cyst, probably physiologic.   Original Report Authenticated By: D. Andria Rhein, MD     Anti-infectives: Anti-infectives     Start     Dose/Rate Route Frequency Ordered Stop   05/03/12 1200   ciprofloxacin (CIPRO) IVPB 400 mg        400 mg 200 mL/hr over 60 Minutes Intravenous Every 12 hours 05/03/12 0440 05/03/12 2359   05/03/12 1000   metroNIDAZOLE (FLAGYL) IVPB 500 mg        500 mg 100 mL/hr over 60 Minutes Intravenous Every 8 hours 05/03/12 0440 05/03/12 1759   05/03/12 0600   metroNIDAZOLE (FLAGYL) IVPB 500 mg        500 mg 100 mL/hr over 60 Minutes Intravenous On call to O.R. 05/03/12 4098 05/03/12 1191  05/03/12 0600   ciprofloxacin (CIPRO) IVPB 400 mg        400 mg 200 mL/hr over 60 Minutes Intravenous On call to O.R. 05/03/12 0143 05/03/12 0230           Assessment/Plan  1. Acute appendicitis, s/p lap appy  Plan: 1. Will recheck later today.  If able to tolerate a diet and pain is controlled will try to let go home.   LOS: 1 day    Said Rueb E 05/03/2012, 8:17 AM Pager: [phone]

## 2012-05-03 NOTE — Preoperative (Signed)
Beta Blockers   Reason not to administer Beta Blockers:Not Applicable 

## 2012-05-03 NOTE — Op Note (Signed)
OPERATIVE REPORT  DATE OF OPERATION: 05/02/2012 - 05/03/2012  PATIENT:  Madeline Perkins  27 y.o. female  PRE-OPERATIVE DIAGNOSIS:  Acute appendicitis  POST-OPERATIVE DIAGNOSIS:  acute appendicitis   PROCEDURE:  Procedure(s): APPENDECTOMY LAPAROSCOPIC  SURGEON:  Surgeon(s): Gwenyth Ober, MD  ASSISTANT: None  ANESTHESIA:   general  EBL: <20 ml  BLOOD ADMINISTERED: none  DRAINS: none   SPECIMEN:  Source of Specimen:  Appendix  COUNTS CORRECT:  YES  PROCEDURE DETAILS: The patient was taken to the operating room and placed on the table in the supine position. After an adequate general endotracheal anesthetic was administered she was prepped and draped in the usual sterile manner exposing her entire abdomen.  After proper time out was performed identifying the patient and the procedure be performed an infraumbilical curvilinear incision along the curvature of the umbilicus was made and incision using a #11 blade. This taken down to the midline fascia which was subsequently incised with a 15 blade. Kocher clamps used to tent up the fascia as we bluntly dissect down into the peritoneal cavity. A pursetring suture was then passed around the fascial opening then a Hassan cannula passed into the peritoneal cavity. Carbon dioxide gas was insufflated through the Hassan cannula up to a maximal intra-abdominal pressure of 15 mm of mercury.  The patient was placed in Trendelenburg in the left side was tilted down. A right upper quadrant 5 mm cannula was passed under direct vision. A large grasper was passed through the cannula to mobilize the cecum and we do see if the appendix was truly inflamed. The inflamed appendix was verified.  AlLeft lower quadrant 12 mm cannula was passed under direct vision. With all cannulas in place we were able to dissect out the appendix at the base of the cecum and come across with an Endo GIA with 3.5 mm blue cartridge. Subsequently the mesoappendix was mobilized  and we came across that with a 2.5 mm wide Endo GIA. Once this was done the appendix was completely detached. We used an Endo Catch bag to retrieved it from the left lower quadrant site. Once this was done those minimal bleeding. We irrigated with about 200 cc of saline. We then passed the camera through the left lower quadrant 12 mm side as we removed the Hassan cannula and tied off the fascial opening using the pursestring suture which was in place. We aspirated above the liver of all gas and fluids.The patient was placed back to her neutral position. All counts were correct. We subsequently removed all devices.  We injected .25% Marcaine into all incisions. We closed the left lower quadrant and infraumbilical incisions using a running subcuticular stitch of 4-0 Monocryl. Dermabond Steri-Strips and Tegaderms use complete all dressings all counts were correct.  PATIENT DISPOSITION:  PACU - hemodynamically stable.   Gwenyth Ober 1/13/20143:46 AM

## 2012-05-04 ENCOUNTER — Encounter (HOSPITAL_COMMUNITY): Payer: Self-pay | Admitting: General Surgery

## 2012-05-04 MED ORDER — OXYCODONE-ACETAMINOPHEN 5-325 MG PO TABS: 1.0000 | ORAL_TABLET | ORAL | Status: DC | PRN

## 2012-05-04 NOTE — Addendum Note (Signed)
Addendum  created 05/04/12 1204 by Adair Laundry, CRNA   Modules edited:Charges VN

## 2012-05-04 NOTE — ED Provider Notes (Signed)
Medical screening examination/treatment/procedure(s) were performed by non-physician practitioner and as supervising physician I was immediately available for consultation/collaboration.   Mirna Mires, MD 05/04/12 [phone]

## 2012-05-04 NOTE — Progress Notes (Signed)
General Surgery Daily Progress Note: Subjective: Feeling sore but otherwise well. Tolerating diet. + flatus, voiding, no bowel movement. Walking down the hall. No nausea, no vomiting.   Objective: Vital signs in last 24 hours: Temp:  [97.7 F (36.5 C)-98.7 F (37.1 C)] 97.7 F (36.5 C) (01/14 0559) Pulse Rate:  [71-72] 71  (01/14 0559) Resp:  [18] 18  (01/14 0559) BP: (95-106)/(57-65) 105/57 mmHg (01/14 0559) SpO2:  [100 %] 100 % (01/14 0559) Last BM Date: 04/30/12  Intake/Output from previous day: 01/13 0701 - 01/14 0700 In: 3216.7 [P.O.:1200; I.V.:2016.7] Out: -  Intake/Output this shift:  urine x3 General appearance: alert, cooperative and no distress GI: soft, bowel sounds present, non tender, non distended, incision sites clean and intact without surrounding erythema  Lab Results:   [facility] 05/02/12 2124  WBC 17.7*  HGB 13.5  HCT 40.6  PLT 280   BMET  Basename 05/02/12 2124  NA 134*  K 4.1  CL 99  CO2 26  GLUCOSE 90  BUN 8  CREATININE 0.86  CALCIUM 10.1   PT/INR No results found for this basename: LABPROT:2,INR:2 in the last 72 hours ABG No results found for this basename: PHART:2,PCO2:2,PO2:2,HCO3:2 in the last 72 hours  Studies/Results: Ct Abdomen Pelvis W Contrast  05/03/2012  *RADIOLOGY REPORT*  Clinical Data: Right lower abdominal pain  CT ABDOMEN AND PELVIS WITH CONTRAST  Technique:  Multidetector CT imaging of the abdomen and pelvis was performed following the standard protocol during bolus administration of intravenous contrast.  Contrast: OMNIPAQUE IOHEXOL 300 MG/ML  SOLN  Comparison: None.  Findings: Bilateral breast implants are partially seen.  Visualized lung bases clear.  Unremarkable liver, gallbladder, spleen, adrenal glands, kidneys, pancreas, abdominal aorta, portal vein.  Stomach, small bowel, and colon are nondilated.  The appendix is thick-walled, dilated to a diameter of 11 mm, with some adjacent inflammatory/edematous changes.  No  evidence of perforation or abscess.  3.8 cm left adnexal cyst.  Uterus and right adnexal region unremarkable.  No free fluid.  Urinary bladder incompletely distended.  Regional bones unremarkable.  IMPRESSION:  1.  Acute appendicitis without perforation or abscess. I telephoned the critical test results to Magnus Sinning at the time of interpretation. 2.  Left ovarian cyst, probably physiologic.   Original Report Authenticated By: D. Andria Rhein, MD     Anti-infectives: Anti-infectives     Start     Dose/Rate Route Frequency Ordered Stop   05/03/12 1200   ciprofloxacin (CIPRO) IVPB 400 mg        400 mg 200 mL/hr over 60 Minutes Intravenous Every 12 hours 05/03/12 0440 05/03/12 1226   05/03/12 1000   metroNIDAZOLE (FLAGYL) IVPB 500 mg        500 mg 100 mL/hr over 60 Minutes Intravenous Every 8 hours 05/03/12 0440 05/03/12 1033   05/03/12 0600   metroNIDAZOLE (FLAGYL) IVPB 500 mg        500 mg 100 mL/hr over 60 Minutes Intravenous On call to O.R. 05/03/12 0143 05/03/12 0306   05/03/12 0600   ciprofloxacin (CIPRO) IVPB 400 mg        400 mg 200 mL/hr over 60 Minutes Intravenous On call to O.R. 05/03/12 0143 05/03/12 0230          Assessment/Plan: 27 yo female post op day 1, s/p lap appe. Doing well.  Plan: - d/c home with pain medicine today - follow up with [facility] in 2 weeks.    LOS: 2 days  Marena Chancy 05/04/2012

## 2012-05-04 NOTE — Progress Notes (Signed)
Utilization review completed. Felton Buczynski, RN, BSN. 

## 2012-05-04 NOTE — Discharge Summary (Signed)
Physician Discharge Summary  Patient ID: Madeline Perkins MRN: [id number] DOB/AGE: [birth year] 26 y.o.  Admit date: 05/02/2012 Discharge date: 05/04/2012  Admitting Diagnosis: Acute Appendicitis  Discharge Diagnosis Acute Appendicitis  Consultants None  Procedures Laparoscopic Appendectomy  Hospital Course 27 yo female who presented to [facility] with abdominal pain.  Workup showed appendicitis.  Patient was admitted and underwent procedure listed above.  Tolerated procedure well and was transferred to the floor.  Diet was advanced as tolerated.  On POD#1, the patient was voiding well, tolerating diet, ambulating well, pain well controlled, vital signs stable, incisions c/d/i and felt stable for discharge home.  Patient will follow up in our office in 2 weeks and knows to call with questions or concerns.    Medication List     As of 05/04/2012 10:28 AM    STOP taking these medications         acetaminophen 500 MG tablet   Commonly known as: TYLENOL      TAKE these medications         bismuth subsalicylate 262 MG/15ML suspension   Commonly known as: PEPTO BISMOL   Take 15 mLs by mouth every 6 (six) hours as needed. For upset stomach      desogestrel-ethinyl estradiol 0.15-0.02/0.01 MG (21/5) tablet   Commonly known as: KARIVA,AZURETTE,MIRCETTE   Take 1 tablet by mouth daily.      oxyCODONE-acetaminophen 5-325 MG per tablet   Commonly known as: PERCOCET/ROXICET   Take 1-2 tablets by mouth every 4 (four) hours as needed.          Follow-up Information    Follow up with [facility]. Schedule an appointment as soon as possible for a visit in 2 weeks.   Contact information:   [address] [place] [address] [phone]         Signed: Marena Chancy, PGY-2 [facility] Medicine Resident [facility] [place] [phone]  05/04/2012, 10:28 AM

## 2012-05-07 ENCOUNTER — Telehealth (INDEPENDENT_AMBULATORY_CARE_PROVIDER_SITE_OTHER): Payer: Self-pay | Admitting: General Surgery

## 2012-05-07 NOTE — Telephone Encounter (Signed)
Pt called to report she had emergency lap appe on 05/03/12, per Dr. Lindie Spruce.  She works a [employer] as an [employer].  She had discussed with Dr. Lindie Spruce a RTW after 1 week.  Explained she would be limited by written restriction to no lifting, carrying, pushing or pulling more than 20 lbs.  Pt states she can do modified assignment within this.  She will call when she is ready to RTW.  Needs post op appt

## 2012-05-10 ENCOUNTER — Encounter (INDEPENDENT_AMBULATORY_CARE_PROVIDER_SITE_OTHER): Payer: Self-pay | Admitting: General Surgery

## 2012-05-10 NOTE — Telephone Encounter (Signed)
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   If OK with Dr. Lindie Spruce, she would like to try to RTW on Wednesday or Thursday this week, for half-days the rest of the week, then full days next week.  Please advise.

## 2012-05-25 ENCOUNTER — Encounter (INDEPENDENT_AMBULATORY_CARE_PROVIDER_SITE_OTHER): Payer: Self-pay | Admitting: General Surgery

## 2012-05-25 ENCOUNTER — Ambulatory Visit (INDEPENDENT_AMBULATORY_CARE_PROVIDER_SITE_OTHER): Payer: Commercial Managed Care - PPO | Admitting: General Surgery

## 2012-05-25 VITALS — BP 118/74 | HR 88 | Temp 97.5°F | Resp 16 | Ht 70.0 in | Wt 160.0 lb

## 2012-05-25 DIAGNOSIS — Z09 Encounter for follow-up examination after completed treatment for conditions other than malignant neoplasm: Secondary | ICD-10-CM

## 2012-05-25 NOTE — Progress Notes (Signed)
The patient is status post laparoscopic appendectomy. She is doing very well. Her incisions have healed very well with no evidence of infection. You can hardly see her scar.  The pathology demonstrated a full-thickness appendicitis. She is to return to see me on a when necessary basis.

## 2012-07-01 ENCOUNTER — Other Ambulatory Visit: Payer: Self-pay

## 2012-07-13 ENCOUNTER — Encounter (INDEPENDENT_AMBULATORY_CARE_PROVIDER_SITE_OTHER): Payer: Self-pay | Admitting: General Surgery

## 2012-07-13 ENCOUNTER — Telehealth (INDEPENDENT_AMBULATORY_CARE_PROVIDER_SITE_OTHER): Payer: Self-pay | Admitting: General Surgery

## 2012-07-13 ENCOUNTER — Other Ambulatory Visit: Payer: Self-pay | Admitting: Surgery

## 2012-07-13 ENCOUNTER — Ambulatory Visit (INDEPENDENT_AMBULATORY_CARE_PROVIDER_SITE_OTHER): Payer: Commercial Managed Care - PPO | Admitting: General Surgery

## 2012-07-13 VITALS — BP 118/62 | HR 63 | Temp 98.4°F | Resp 18 | Ht 70.0 in | Wt 163.0 lb

## 2012-07-13 DIAGNOSIS — C4359 Malignant melanoma of other part of trunk: Secondary | ICD-10-CM | POA: Insufficient documentation

## 2012-07-13 NOTE — Progress Notes (Signed)
The patient comes in as a former patient of mine from a laparoscopic appendectomy approximately 5 weeks ago..  The patient had a nevus start a change in her right lower back. 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 The Breslow depth was 0.92 mm.  The lesion is still an open sore approximately 1 cm in greatest diameter. It is in the lower portion of her right mid back. She will need a lymphoscintigraphy done in order to identify the basin of lymph node drainage. This will be scheduled as soon as possible.  The patient is also scheduled for a total body surveillance for other possible pigmented lesions. We will await that surveillance scan to be done prior to surgery.  I will talk to the patient once we've had the lymphoscintigraphy done. I would expect the surgery will be near the end of April or early part of May.

## 2012-07-13 NOTE — Telephone Encounter (Signed)
Patient is aware of appt at [place] 07/21/12

## 2012-07-15 ENCOUNTER — Encounter (HOSPITAL_COMMUNITY)
Admission: RE | Admit: 2012-07-15 | Discharge: 2012-07-15 | Disposition: A | Discharge status: Home / Self Care | Payer: 59 | Source: Ambulatory Visit | Attending: General Surgery | Admitting: General Surgery

## 2012-07-15 DIAGNOSIS — C4359 Malignant melanoma of other part of trunk: Secondary | ICD-10-CM | POA: Insufficient documentation

## 2012-07-15 MED ORDER — TECHNETIUM TC 99M SULFUR COLLOID FILTERED
500.0000 | Freq: Once | INTRAVENOUS | Status: AC | PRN
  Administered 2012-07-15: 500 via INTRADERMAL

## 2012-07-21 ENCOUNTER — Other Ambulatory Visit (HOSPITAL_COMMUNITY): Payer: 59

## 2012-07-23 ENCOUNTER — Other Ambulatory Visit (INDEPENDENT_AMBULATORY_CARE_PROVIDER_SITE_OTHER): Payer: Self-pay | Admitting: General Surgery

## 2012-07-23 DIAGNOSIS — C4359 Malignant melanoma of other part of trunk: Secondary | ICD-10-CM

## 2012-07-27 ENCOUNTER — Encounter (HOSPITAL_COMMUNITY): Payer: Self-pay

## 2012-08-02 ENCOUNTER — Encounter (HOSPITAL_COMMUNITY): Payer: Self-pay

## 2012-08-02 ENCOUNTER — Encounter (HOSPITAL_COMMUNITY)
Admission: RE | Admit: 2012-08-02 | Discharge: 2012-08-02 | Disposition: A | Discharge status: Home / Self Care | Payer: 59 | Source: Ambulatory Visit | Attending: General Surgery | Admitting: General Surgery

## 2012-08-02 DIAGNOSIS — Z01812 Encounter for preprocedural laboratory examination: Secondary | ICD-10-CM | POA: Insufficient documentation

## 2012-08-02 HISTORY — DX: Gastro-esophageal reflux disease without esophagitis: K21.9

## 2012-08-02 HISTORY — DX: Malignant (primary) neoplasm, unspecified: C80.1

## 2012-08-02 LAB — CBC
Hemoglobin: 13.3 g/dL (ref 12.0–15.0)
MCHC: 34 g/dL (ref 30.0–36.0)
WBC: 8.8 10*3/uL (ref 4.0–10.5)

## 2012-08-02 LAB — SURGICAL PCR SCREEN
MRSA, PCR: NEGATIVE
Staphylococcus aureus: NEGATIVE

## 2012-08-02 NOTE — Pre-Procedure Instructions (Signed)
Madeline Perkins  08/02/2012  Your procedure is scheduled on:  Tuesday, April 22nd.  Report to [facility] at 7:00 AM.  Call this number if you have problems the morning of surgery: [phone]   Remember:   Do not eat food or drink liquids after midnight.   Take these medicines the morning of surgery with A SIP OF WATER:  desogestrel-ethinyl estradiol (KARIVA,AZURETTE,MIRCETTE) 0.15-0.02/0.01 MG (21/5) tablet   Do not wear jewelry, make-up or nail polish.  Do not wear lotions, powders, or perfumes. You may wear deodorant.  Do not shave 48 hours prior to surgery.   Do not bring valuables to the hospital.  Contacts, dentures or bridgework may not be worn into surgery.  Leave suitcase in the car. After surgery it may be brought to your room.  For patients admitted to the hospital, checkout time is 11:00 AM the day of  discharge.   Patients discharged the day of surgery will not be allowed to drive  home.  Name and phone number of your driver: -  Special Instructions: Shower using CHG 2 nights before surgery and the night before surgery.  If you shower the day of surgery use CHG.  Use special wash - you have one bottle of CHG for all showers.  You should use approximately 1/3 of the bottle for each shower.   Please read over the following fact sheets that you were given: Pain Booklet, Coughing and Deep Breathing and Surgical Site Infection Prevention

## 2012-08-03 LAB — HCG, SERUM, QUALITATIVE: Preg, Serum: NEGATIVE

## 2012-08-09 MED ORDER — CIPROFLOXACIN IN D5W 400 MG/200ML IV SOLN
400.0000 mg | INTRAVENOUS | Status: AC
  Administered 2012-08-10: 400 mg via INTRAVENOUS
  Filled 2012-08-09: qty 200

## 2012-08-10 ENCOUNTER — Encounter (HOSPITAL_COMMUNITY)
Admission: RE | Disposition: A | Discharge status: Home / Self Care | Payer: Self-pay | Source: Ambulatory Visit | Attending: General Surgery

## 2012-08-10 ENCOUNTER — Ambulatory Visit (HOSPITAL_COMMUNITY)
Admission: RE | Admit: 2012-08-10 | Discharge: 2012-08-10 | Disposition: A | Discharge status: Home / Self Care | Payer: 59 | Source: Ambulatory Visit | Attending: General Surgery | Admitting: General Surgery

## 2012-08-10 ENCOUNTER — Encounter (HOSPITAL_COMMUNITY): Payer: Self-pay | Admitting: *Deleted

## 2012-08-10 ENCOUNTER — Ambulatory Visit (HOSPITAL_COMMUNITY): Payer: 59 | Admitting: Anesthesiology

## 2012-08-10 ENCOUNTER — Encounter (HOSPITAL_COMMUNITY): Payer: Self-pay | Admitting: Anesthesiology

## 2012-08-10 DIAGNOSIS — C4359 Malignant melanoma of other part of trunk: Secondary | ICD-10-CM

## 2012-08-10 DIAGNOSIS — K219 Gastro-esophageal reflux disease without esophagitis: Secondary | ICD-10-CM | POA: Insufficient documentation

## 2012-08-10 HISTORY — PX: MELANOMA EXCISION WITH SENTINEL LYMPH NODE BIOPSY: SHX5267

## 2012-08-10 SURGERY — EXCISION, MELANOMA, WITH SENTINEL LYMPH NODE BIOPSY
Anesthesia: General | Site: Back

## 2012-08-10 SURGERY — MELANOMA EXCISION WITH SENTINEL LYMPH NODE BIOPSY
Anesthesia: General | Site: Back | Wound class: Clean

## 2012-08-10 MED ORDER — 0.9 % SODIUM CHLORIDE (POUR BTL) OPTIME
TOPICAL | Status: DC | PRN
  Administered 2012-08-10: 1000 mL

## 2012-08-10 MED ORDER — PROMETHAZINE HCL 25 MG/ML IJ SOLN: 6.2500 mg | INTRAMUSCULAR | Status: DC | PRN

## 2012-08-10 MED ORDER — ROCURONIUM BROMIDE 100 MG/10ML IV SOLN
INTRAVENOUS | Status: DC | PRN
  Administered 2012-08-10: 35 mg via INTRAVENOUS

## 2012-08-10 MED ORDER — OXYCODONE HCL 5 MG PO TABS: 5.0000 mg | ORAL_TABLET | Freq: Once | ORAL | Status: DC | PRN

## 2012-08-10 MED ORDER — SODIUM CHLORIDE 0.9 % IJ SOLN
INTRAMUSCULAR | Status: DC | PRN
  Administered 2012-08-10: 11:00:00 via INTRAMUSCULAR

## 2012-08-10 MED ORDER — CHLORHEXIDINE GLUCONATE 4 % EX LIQD: 1.0000 "application " | Freq: Once | CUTANEOUS | Status: DC

## 2012-08-10 MED ORDER — FENTANYL CITRATE 0.05 MG/ML IJ SOLN
INTRAMUSCULAR | Status: DC | PRN
  Administered 2012-08-10 (×4): 50 ug via INTRAVENOUS

## 2012-08-10 MED ORDER — NEOSTIGMINE METHYLSULFATE 1 MG/ML IJ SOLN
INTRAMUSCULAR | Status: DC | PRN
  Administered 2012-08-10: 2.5 mg via INTRAVENOUS

## 2012-08-10 MED ORDER — BUPIVACAINE-EPINEPHRINE 0.25% -1:200000 IJ SOLN
INTRAMUSCULAR | Status: DC | PRN
  Administered 2012-08-10: 20 mL

## 2012-08-10 MED ORDER — PHENYLEPHRINE HCL 10 MG/ML IJ SOLN
INTRAMUSCULAR | Status: DC | PRN
  Administered 2012-08-10 (×2): 80 ug via INTRAVENOUS

## 2012-08-10 MED ORDER — PROPOFOL 10 MG/ML IV BOLUS
INTRAVENOUS | Status: DC | PRN
  Administered 2012-08-10: 200 mg via INTRAVENOUS

## 2012-08-10 MED ORDER — LIDOCAINE HCL 4 % MT SOLN
OROMUCOSAL | Status: DC | PRN
  Administered 2012-08-10: 4 mL via TOPICAL

## 2012-08-10 MED ORDER — OXYCODONE HCL 5 MG/5ML PO SOLN: 5.0000 mg | Freq: Once | ORAL | Status: DC | PRN

## 2012-08-10 MED ORDER — HYDROCODONE-ACETAMINOPHEN 5-325 MG PO TABS: 1.0000 | ORAL_TABLET | Freq: Four times a day (QID) | ORAL | Status: DC | PRN

## 2012-08-10 MED ORDER — LACTATED RINGERS IV SOLN
INTRAVENOUS | Status: DC
  Administered 2012-08-10: 08:00:00 via INTRAVENOUS

## 2012-08-10 MED ORDER — TECHNETIUM TC 99M SULFUR COLLOID FILTERED
0.5000 | Freq: Once | INTRAVENOUS | Status: AC | PRN
  Administered 2012-08-10: 0.5 via INTRADERMAL

## 2012-08-10 MED ORDER — MIDAZOLAM HCL 5 MG/5ML IJ SOLN
INTRAMUSCULAR | Status: DC | PRN
  Administered 2012-08-10: 2 mg via INTRAVENOUS

## 2012-08-10 MED ORDER — MEPERIDINE HCL 25 MG/ML IJ SOLN: 6.2500 mg | INTRAMUSCULAR | Status: DC | PRN

## 2012-08-10 MED ORDER — ONDANSETRON HCL 4 MG/2ML IJ SOLN
INTRAMUSCULAR | Status: DC | PRN
  Administered 2012-08-10: 4 mg via INTRAVENOUS

## 2012-08-10 MED ORDER — BUPIVACAINE-EPINEPHRINE 0.25% -1:200000 IJ SOLN
INTRAMUSCULAR | Status: AC
  Filled 2012-08-10: qty 1

## 2012-08-10 MED ORDER — METHYLENE BLUE 1 % INJ SOLN
INTRAMUSCULAR | Status: AC
  Filled 2012-08-10: qty 10

## 2012-08-10 MED ORDER — GLYCOPYRROLATE 0.2 MG/ML IJ SOLN
INTRAMUSCULAR | Status: DC | PRN
  Administered 2012-08-10: .5 mg via INTRAVENOUS

## 2012-08-10 MED ORDER — HYDROMORPHONE HCL PF 1 MG/ML IJ SOLN: 0.2500 mg | INTRAMUSCULAR | Status: DC | PRN

## 2012-08-10 MED ORDER — HYDROCODONE-ACETAMINOPHEN 5-325 MG PO TABS
1.0000 | ORAL_TABLET | Freq: Once | ORAL | Status: AC
  Administered 2012-08-10: 1 via ORAL
  Filled 2012-08-10 (×2): qty 1

## 2012-08-10 MED ORDER — EPHEDRINE SULFATE 50 MG/ML IJ SOLN
INTRAMUSCULAR | Status: DC | PRN
  Administered 2012-08-10 (×2): 10 mg via INTRAVENOUS

## 2012-08-10 MED ORDER — LIDOCAINE HCL (CARDIAC) 20 MG/ML IV SOLN
INTRAVENOUS | Status: DC | PRN
  Administered 2012-08-10: 70 mg via INTRAVENOUS

## 2012-08-10 SURGICAL SUPPLY — 50 items
APPLIER CLIP 9.375 SM OPEN (CLIP) ×2
BLADE SURG 15 STRL LF DISP TIS (BLADE) ×2 IMPLANT
BLADE SURG 15 STRL SS (BLADE) ×2
CHLORAPREP W/TINT 26ML (MISCELLANEOUS) ×4 IMPLANT
CLIP APPLIE 9.375 SM OPEN (CLIP) ×1 IMPLANT
CLOTH BEACON ORANGE TIMEOUT ST (SAFETY) ×2 IMPLANT
CONT SPEC 4OZ CLIKSEAL STRL BL (MISCELLANEOUS) ×2 IMPLANT
COVER SURGICAL LIGHT HANDLE (MISCELLANEOUS) ×2 IMPLANT
COVER TRANSDUCER ULTRASND GEL (DRAPE) ×2 IMPLANT
DERMABOND ADVANCED (GAUZE/BANDAGES/DRESSINGS) ×2
DERMABOND ADVANCED .7 DNX12 (GAUZE/BANDAGES/DRESSINGS) ×2 IMPLANT
DRAPE PED LAPAROTOMY (DRAPES) ×4 IMPLANT
DRAPE UTILITY 15X26 W/TAPE STR (DRAPE) ×8 IMPLANT
DRSG TEGADERM 4X4.75 (GAUZE/BANDAGES/DRESSINGS) ×4 IMPLANT
ELECT CAUTERY BLADE 6.4 (BLADE) ×4 IMPLANT
ELECT REM PT RETURN 9FT ADLT (ELECTROSURGICAL) ×4
ELECTRODE REM PT RTRN 9FT ADLT (ELECTROSURGICAL) ×2 IMPLANT
GAUZE SPONGE 4X4 16PLY XRAY LF (GAUZE/BANDAGES/DRESSINGS) ×4 IMPLANT
GLOVE BIO SURGEON STRL SZ7 (GLOVE) ×2 IMPLANT
GLOVE BIO SURGEON STRL SZ7.5 (GLOVE) ×2 IMPLANT
GLOVE BIOGEL PI IND STRL 7.0 (GLOVE) ×1 IMPLANT
GLOVE BIOGEL PI IND STRL 7.5 (GLOVE) ×1 IMPLANT
GLOVE BIOGEL PI IND STRL 8 (GLOVE) ×2 IMPLANT
GLOVE BIOGEL PI INDICATOR 7.0 (GLOVE) ×1
GLOVE BIOGEL PI INDICATOR 7.5 (GLOVE) ×1
GLOVE BIOGEL PI INDICATOR 8 (GLOVE) ×2
GLOVE ECLIPSE 7.5 STRL STRAW (GLOVE) ×4 IMPLANT
GLOVE SS BIOGEL STRL SZ 6.5 (GLOVE) ×2 IMPLANT
GLOVE SUPERSENSE BIOGEL SZ 6.5 (GLOVE) ×2
GOWN STRL NON-REIN LRG LVL3 (GOWN DISPOSABLE) ×8 IMPLANT
KIT BASIN OR (CUSTOM PROCEDURE TRAY) ×2 IMPLANT
KIT ROOM TURNOVER OR (KITS) ×2 IMPLANT
NEEDLE 18GX1X1/2 (RX/OR ONLY) (NEEDLE) ×2 IMPLANT
NEEDLE HYPO 25GX1X1/2 BEV (NEEDLE) ×6 IMPLANT
NS IRRIG 1000ML POUR BTL (IV SOLUTION) ×2 IMPLANT
PACK SURGICAL SETUP 50X90 (CUSTOM PROCEDURE TRAY) ×2 IMPLANT
PAD ARMBOARD 7.5X6 YLW CONV (MISCELLANEOUS) ×6 IMPLANT
PENCIL BUTTON HOLSTER BLD 10FT (ELECTRODE) ×2 IMPLANT
SPECIMEN JAR SMALL (MISCELLANEOUS) ×2 IMPLANT
SPONGE GAUZE 4X4 12PLY (GAUZE/BANDAGES/DRESSINGS) IMPLANT
STRIP CLOSURE SKIN 1/4X4 (GAUZE/BANDAGES/DRESSINGS) ×2 IMPLANT
SUT MNCRL AB 4-0 PS2 18 (SUTURE) ×2 IMPLANT
SUT SILK 2 0 SH (SUTURE) ×2 IMPLANT
SUT VIC AB 3-0 SH 27 (SUTURE) ×3
SUT VIC AB 3-0 SH 27XBRD (SUTURE) ×3 IMPLANT
SUT VICRYL 4-0 PS2 18IN ABS (SUTURE) ×2 IMPLANT
SYR CONTROL 10ML LL (SYRINGE) ×4 IMPLANT
TOWEL OR 17X24 6PK STRL BLUE (TOWEL DISPOSABLE) IMPLANT
TOWEL OR 17X26 10 PK STRL BLUE (TOWEL DISPOSABLE) ×2 IMPLANT
YANKAUER SUCT BULB TIP NO VENT (SUCTIONS) IMPLANT

## 2012-08-10 NOTE — Preoperative (Signed)
Beta Blockers   Reason not to administer Beta Blockers:Not Applicable 

## 2012-08-10 NOTE — Transfer of Care (Signed)
Immediate Anesthesia Transfer of Care Note  Patient: Madeline Perkins  Procedure(s) Performed: Procedure(s): RE-EXCISION BACK MELANOMA WITH SENTINEL LEFT INGUINAL LYMPH NODE BIOPSY (N/A)  Patient Location: PACU  Anesthesia Type:General  Level of Consciousness: awake, alert  and oriented  Airway & Oxygen Therapy: Patient Spontanous Breathing and Patient connected to nasal cannula oxygen  Post-op Assessment: Report given to PACU RN, Post -op Vital signs reviewed and stable and Patient moving all extremities X 4  Post vital signs: Reviewed and stable  Complications: No apparent anesthesia complications

## 2012-08-10 NOTE — Anesthesia Preprocedure Evaluation (Addendum)
Anesthesia Evaluation  Patient identified by MRN, date of birth, ID band Patient awake    Reviewed: Allergy & Precautions, H&P , NPO status , Patient's Chart, lab work & pertinent test results  History of Anesthesia Complications Negative for: history of anesthetic complications  Airway Mallampati: I  Neck ROM: full    Dental no notable dental hx. (+) Teeth Intact   Pulmonary neg pulmonary ROS,  breath sounds clear to auscultation  Pulmonary exam normal       Cardiovascular negative cardio ROS  IRhythm:regular Rate:Normal     Neuro/Psych negative neurological ROS  negative psych ROS   GI/Hepatic negative GI ROS, Neg liver ROS, GERD-  ,  Endo/Other  negative endocrine ROS  Renal/GU negative Renal ROS  negative genitourinary   Musculoskeletal   Abdominal   Peds  Hematology negative hematology ROS (+)   Anesthesia Other Findings   Reproductive/Obstetrics negative OB ROS                          Anesthesia Physical Anesthesia Plan  ASA: I  Anesthesia Plan: General and General ETT   Post-op Pain Management:    Induction:   Airway Management Planned:   Additional Equipment:   Intra-op Plan:   Post-operative Plan:   Informed Consent: I have reviewed the patients History and Physical, chart, labs and discussed the procedure including the risks, benefits and alternatives for the proposed anesthesia with the patient or authorized representative who has indicated his/her understanding and acceptance.     Plan Discussed with: CRNA and Surgeon  Anesthesia Plan Comments:         Anesthesia Quick Evaluation

## 2012-08-10 NOTE — Anesthesia Procedure Notes (Signed)
Procedure Name: Intubation Date/Time: 08/10/2012 9:48 AM Performed by: Quentin Ore Pre-anesthesia Checklist: Patient identified, Emergency Drugs available, Suction available, Patient being monitored and Timeout performed Patient Re-evaluated:Patient Re-evaluated prior to inductionOxygen Delivery Method: Circle system utilized Preoxygenation: Pre-oxygenation with 100% oxygen Intubation Type: IV induction Ventilation: Mask ventilation without difficulty Laryngoscope Size: Mac and 3 Grade View: Grade I Tube type: Oral Tube size: 7.0 mm Number of attempts: 1 Airway Equipment and Method: Stylet and LTA kit utilized Placement Confirmation: ETT inserted through vocal cords under direct vision,  positive ETCO2 and breath sounds checked- equal and bilateral Secured at: 21 cm Tube secured with: Tape Dental Injury: Teeth and Oropharynx as per pre-operative assessment

## 2012-08-10 NOTE — Interval H&P Note (Signed)
History and Physical Interval Note:  08/10/2012 8:07 AM  Madeline Perkins  has presented today for surgery, with the diagnosis of Melanoma of right mid-back  The various methods of treatment have been discussed with the patient and family. After consideration of risks, benefits and other options for treatment, the patient has consented to  Procedure(s): RE-EXCISION  of back wound WITH SENTINEL LYMPH NODE BIOPSY (Right) as a surgical intervention .  The patient's history has been reviewed, patient examined, no change in status, stable for surgery.  I have reviewed the patient's chart and labs.  Questions were answered to the patient's satisfaction.    The patient has a more complete H&P on chart from acute appendicitis within the last 2 months.  She was found to have an unusual nevus on her back and subsequent biopsy revealed Clark's Level III melanoma.  She had lymphoscintigraphy performed which demonstrated drainage to the bilateral axilla and the right groin.  She will require re-excision of the back wound with SLN biopsy of the right groin and possibly the axilla.  She will start off in the prone position and be flipped for the sentinel LN biopsy.  Kathryne Eriksson. Dahlia Bailiff, MD, FACS [phone] [facility]  Amiir Heckard, Kathryne Eriksson

## 2012-08-10 NOTE — Op Note (Signed)
OPERATIVE REPORT  DATE OF OPERATION: 08/10/2012  PATIENT:  Madeline Perkins  27 y.o. female  PRE-OPERATIVE DIAGNOSIS:  Melanoma of right mid-back  POST-OPERATIVE DIAGNOSIS:  Melanoma of right mid-back  PROCEDURE:  Procedure(s): RE-EXCISION BACK MELANOMA WITH SENTINEL LEFT INGUINAL LYMPH NODE BIOPSY  SURGEON:  Surgeon(s): Gwenyth Ober, MD  ASSISTANT: None  ANESTHESIA:   general  EBL: <20 ml  BLOOD ADMINISTERED: none  DRAINS: none   SPECIMEN:  Source of Specimen:  Re-excised melanomal site with margins, and left inguinal sentinel LN with blue dye  COUNTS CORRECT:  YES  PROCEDURE DETAILS: The patient was taken to the operating room and placed on the table initially in the supine position. A proper time out was performed identifying the patient and the procedure to be performed. We then used a neoprobe in order to investigate bilateral axillae and groins/inguinal area for evidence of radioactive activity. The only site to demonstrate any activity of radioactivity within the left inguinal area. There was absolutely no activity in the axilla or in the right groin.  The patient was subsequently flipped into the prone position on the table and injected with 5 cc of diluted methylene blue around the site of her previously excised melanoma in the right mid to lower back. We then prepped and draped in the usual sterile manner.  We marked the margins that were needed for the reexcision of the melanoma site. We made an oval incision approximately 8 cm long by 2 cm wide. We excised this area using a #15 blade and subsequently electrocautery which was also used to obtain hemostasis. It was excised down through the subcutaneous tissue down to the fascia of the back. We then irrigated the wound reapproximated using deep subcutaneous stitches of 3-0 Vicryl. The skin was then closed using running subcuticular stitch of 4-0 Vicryl. Cortisone Marcaine with epinephrine was injected into subcutaneous  tissue for postoperative pain control.  Dermabond, Steri-Strips, and a Tegaderm were used to complete the dressing on the back.  The patient was then flipped back into the supine position on the table. Again the neoprobe was used to investigate both axilla and groin show evidence for radioactive activity. Again those completely no activity in the right or left axilla and on none in the right inguinal area.  The only site to demonstrate radioactive activity was in the left inguinal area. The site was marked and then she was prepped and draped in the usual sterile manner.  A 2-1/2-3 cm transverse incision was made over the site where the radioactive activity was noted. We dissected down into the subcutaneous tissue and were able to identify a blue stained hot lymph node from the left inguinal area. Small hemoclips were used to assist in the removal by clipping off the lymphatics and then subsequently removing the lymph node.  The LN was again tested positive for radioactivity after was removed from the field. No other areas of radioactive activity were noted in that area.  Hemostasis was obtained with hemoclips and also electrocautery. We then approximated the wound in a similar manner as to the one on the back using subcutaneous 3-0 Vicryl then the skin using a running 4-0 Vicryl. Dermabond Steri-Strips and Tegaderms use complete his dressing. All counts were correct.  PATIENT DISPOSITION:  PACU - hemodynamically stable.   Gwenyth Ober 4/22/201411:28 AM

## 2012-08-10 NOTE — H&P (View-Only) (Signed)
The patient comes in as a former patient of mine from a laparoscopic appendectomy approximately 5 weeks ago..  The patient had a nevus start a change in her right lower back. 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 The Breslow depth was 0.92 mm.  The lesion is still an open sore approximately 1 cm in greatest diameter. It is in the lower portion of her right mid back. She will need a lymphoscintigraphy done in order to identify the basin of lymph node drainage. This will be scheduled as soon as possible.  The patient is also scheduled for a total body surveillance for other possible pigmented lesions. We will await that surveillance scan to be done prior to surgery.  I will talk to the patient once we've had the lymphoscintigraphy done. I would expect the surgery will be near the end of April or early part of May.

## 2012-08-10 NOTE — Anesthesia Postprocedure Evaluation (Signed)
  Anesthesia Post-op Note  Patient: Madeline Perkins  Procedure(s) Performed: Procedure(s): RE-EXCISION BACK MELANOMA WITH SENTINEL LEFT INGUINAL LYMPH NODE BIOPSY (N/A)  Patient Location: PACU  Anesthesia Type:General  Level of Consciousness: awake, alert  and oriented  Airway and Oxygen Therapy: Patient Spontanous Breathing  Post-op Pain: mild  Post-op Assessment: Post-op Vital signs reviewed  Post-op Vital Signs: Reviewed  Complications: No apparent anesthesia complications

## 2012-08-11 ENCOUNTER — Encounter (HOSPITAL_COMMUNITY): Payer: Self-pay | Admitting: General Surgery

## 2012-08-13 ENCOUNTER — Telehealth: Payer: Self-pay | Admitting: General Surgery

## 2012-08-13 NOTE — Telephone Encounter (Signed)
Spoke with patient and gave her the results of her pathology.  Madeline Perkins. Dahlia Bailiff, MD, [place] [phone] [facility]

## 2012-08-16 ENCOUNTER — Encounter (INDEPENDENT_AMBULATORY_CARE_PROVIDER_SITE_OTHER): Payer: Self-pay | Admitting: General Surgery

## 2012-08-16 ENCOUNTER — Telehealth (INDEPENDENT_AMBULATORY_CARE_PROVIDER_SITE_OTHER): Payer: Self-pay | Admitting: *Deleted

## 2012-08-16 ENCOUNTER — Encounter (INDEPENDENT_AMBULATORY_CARE_PROVIDER_SITE_OTHER): Payer: Self-pay | Admitting: *Deleted

## 2012-08-16 NOTE — Telephone Encounter (Signed)
Patient called back after receiving her letter to release her back to work to state that they are needing a listing of what light duty restrictions means.  What exactly are her restrictions so this RN can redo another letter

## 2012-08-24 ENCOUNTER — Ambulatory Visit (INDEPENDENT_AMBULATORY_CARE_PROVIDER_SITE_OTHER): Payer: Commercial Managed Care - PPO | Admitting: General Surgery

## 2012-08-24 ENCOUNTER — Other Ambulatory Visit (INDEPENDENT_AMBULATORY_CARE_PROVIDER_SITE_OTHER): Payer: Self-pay

## 2012-08-24 ENCOUNTER — Encounter (INDEPENDENT_AMBULATORY_CARE_PROVIDER_SITE_OTHER): Payer: Self-pay | Admitting: General Surgery

## 2012-08-24 ENCOUNTER — Encounter (INDEPENDENT_AMBULATORY_CARE_PROVIDER_SITE_OTHER): Payer: Self-pay

## 2012-08-24 VITALS — BP 114/76 | HR 92 | Temp 98.5°F | Resp 16 | Ht 70.0 in | Wt 161.6 lb

## 2012-08-24 DIAGNOSIS — Z09 Encounter for follow-up examination after completed treatment for conditions other than malignant neoplasm: Secondary | ICD-10-CM

## 2012-08-24 DIAGNOSIS — C4359 Malignant melanoma of other part of trunk: Secondary | ICD-10-CM

## 2012-08-24 NOTE — Progress Notes (Signed)
The patient is doing well status post excision of melanoma site on her right lower back. The incision has healed well with no evidence of infection. There is a very slight maybe 1 mm separation in the midportion of the incision but no drainage.  I did place 2 Steri-Strips on the back incision but took the plastic dressing all. Her left inguinal incision looks fine.  As was noted on pathology the patient had no residual disease in her back lesion. There was no metastatic disease in her lymph node on the left side which is the only site where the radioactivity in the methylene blue traced towards.  I will give the patient a followup appointment for medical oncology and likely Dr. Burney Gauze.  She is to return to see me on an as-needed basis.  I released the patient to full duty as of today however her job we'll keep her at light duty until next Monday because of specific requirements.

## 2012-08-25 ENCOUNTER — Telehealth (INDEPENDENT_AMBULATORY_CARE_PROVIDER_SITE_OTHER): Payer: Self-pay | Admitting: General Surgery

## 2012-08-25 ENCOUNTER — Telehealth: Payer: Self-pay | Admitting: Hematology & Oncology

## 2012-08-25 NOTE — Telephone Encounter (Signed)
Spoke with patient she is aware of appt with Dr Myna Hidalgo on 09/09/12 at  315

## 2012-08-25 NOTE — Telephone Encounter (Signed)
Left pt message to call and schedule appointment. (she does not know about 09-09-12)

## 2012-08-26 ENCOUNTER — Telehealth: Payer: Self-pay | Admitting: Hematology & Oncology

## 2012-08-26 NOTE — Telephone Encounter (Signed)
Called pt to get here 5-22 appointment, it appears [facility] gave her our appointment yesterday. I left message for pt to call so I could give her address etc.

## 2012-09-09 ENCOUNTER — Other Ambulatory Visit: Payer: 59 | Admitting: Lab

## 2012-09-09 ENCOUNTER — Ambulatory Visit: Payer: 59

## 2012-09-09 ENCOUNTER — Telehealth: Payer: Self-pay | Admitting: Hematology & Oncology

## 2012-09-09 ENCOUNTER — Telehealth (INDEPENDENT_AMBULATORY_CARE_PROVIDER_SITE_OTHER): Payer: Self-pay | Admitting: General Surgery

## 2012-09-09 ENCOUNTER — Encounter: Payer: 59 | Admitting: Hematology & Oncology

## 2012-09-09 NOTE — Telephone Encounter (Signed)
Left pt message to call if she wants to reschedule missed appointment from today. Madeline Perkins at referring aware pt was a no show.

## 2012-09-09 NOTE — Telephone Encounter (Signed)
Patient was a no  Show for test  Per rick at [facility]

## 2012-09-15 ENCOUNTER — Telehealth: Payer: Self-pay | Admitting: Hematology & Oncology

## 2012-09-15 NOTE — Telephone Encounter (Signed)
Pt left message wanting to reschedule no show appointment. I left her message to call

## 2012-09-22 ENCOUNTER — Telehealth: Payer: Self-pay | Admitting: Hematology & Oncology

## 2012-09-22 NOTE — Telephone Encounter (Signed)
Pt left message to reschedule appointment. I called left her message to call and schedule appointment.

## 2012-09-23 ENCOUNTER — Telehealth: Payer: Self-pay | Admitting: Hematology & Oncology

## 2012-09-23 NOTE — Telephone Encounter (Signed)
Talked to Patient rescheduled 5-22 no show to 10-27-12

## 2012-10-27 ENCOUNTER — Other Ambulatory Visit (HOSPITAL_BASED_OUTPATIENT_CLINIC_OR_DEPARTMENT_OTHER): Payer: 59 | Admitting: Lab

## 2012-10-27 ENCOUNTER — Ambulatory Visit: Payer: 59

## 2012-10-27 ENCOUNTER — Ambulatory Visit (HOSPITAL_BASED_OUTPATIENT_CLINIC_OR_DEPARTMENT_OTHER): Payer: 59 | Admitting: Hematology & Oncology

## 2012-10-27 VITALS — BP 115/69 | HR 67 | Temp 98.0°F | Resp 16 | Ht 70.0 in | Wt 164.0 lb

## 2012-10-27 DIAGNOSIS — C4359 Malignant melanoma of other part of trunk: Secondary | ICD-10-CM

## 2012-10-27 LAB — CBC WITH DIFFERENTIAL (CANCER CENTER ONLY)
BASO#: 0 10*3/uL (ref 0.0–0.2)
EOS%: 1.7 % (ref 0.0–7.0)
HCT: 38.4 % (ref 34.8–46.6)
HGB: 12.4 g/dL (ref 11.6–15.9)
LYMPH%: 24 % (ref 14.0–48.0)
MCH: 27.3 pg (ref 26.0–34.0)
MCHC: 32.3 g/dL (ref 32.0–36.0)
MCV: 84 fL (ref 81–101)
MONO%: 6.8 % (ref 0.0–13.0)
NEUT%: 67.1 % (ref 39.6–80.0)

## 2012-10-27 LAB — COMPREHENSIVE METABOLIC PANEL
AST: 15 U/L (ref 0–37)
Alkaline Phosphatase: 46 U/L (ref 39–117)
BUN: 15 mg/dL (ref 6–23)
Calcium: 9.4 mg/dL (ref 8.4–10.5)
Chloride: 105 mEq/L (ref 96–112)
Creatinine, Ser: 0.95 mg/dL (ref 0.50–1.10)

## 2012-10-27 NOTE — Progress Notes (Signed)
This office note has been dictated.

## 2012-10-28 ENCOUNTER — Telehealth: Payer: Self-pay | Admitting: Hematology & Oncology

## 2012-10-28 NOTE — Progress Notes (Signed)
CC:   Madeline Masse, MD Madeline Perkins. Madeline Perkins, M.D. Madeline Perkins, M.D.  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  HISTORY OF PRESENT ILLNESS:  Madeline Perkins is a very nice 27 year old white female.  She is an Geologist, engineering at [employer].  She has been very healthy.  She has had no past medical problems.  She did have her appendix taken out, I think back in January.  She says that a friend of hers noticed that she had this "mole" on her right lower back.  As such, Madeline Perkins wanted to try to get this looked at.  She ultimately saw Dr. Nena Perkins.  Dr. Allyson Perkins, being incredibly thorough, recognized that this was a problem.  As such, he did a biopsy. The pathology report ([id number]) showed a superficial spreading melanoma arising within a nevus.  This measured 0.92 mm.  There was no ulceration.  There was no satellitosis.  There was no vascular invasion. It had a low mitotic rate.  She then was referred to surgery.  Dr. Hulen Perkins went ahead and did a wide local excision of the melanoma.  He did do a sentinel node biopsy. Surprisingly enough, the sentinel node was the left inguinal region.  The pathology report from the wide local excision ([id number]) showed no residual melanoma.  All margins were negative.  The sentinel lymph node was negative.  She has had no problems with recovery.  She is back working.  Of note, Dr. Allyson Perkins did do a couple of other nevus removals.  These were dysplastic.  Dr. Hulen Perkins kindly referred Madeline Perkins to the [employer] for [place] to continue surveillance on her.  Again, she feels well.  She has had no problems with fatigue or weakness.  She does have fair skin.  She does have blue eyes.  She says that she tans because her father has some [place] in him.  She denies any weight loss or weight gain.  She has had no change in bowel or bladder habits.  She has had no problems with her  monthly cycles.  She has had no headache.  PAST MEDICAL HISTORY:  Unremarkable outside of the appendicitis.  ALLERGIES:  Penicillin.  MEDICATIONS:  Her medications are, I think: 1. Oral contraceptives daily. 2. Iron pills daily. 3. Multivitamins 1 p.o. daily.  SOCIAL HISTORY:  Negative for tobacco use.  She really has no significant alcohol use.  She has no outside facial exposures.  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  FAMILY HISTORY:  Negative for melanoma in the family.  There is no history of skin cancer in the family.  There is history of, I think esophageal cancer I think in her paternal grandmother.  REVIEW OF SYSTEMS:  As stated in history of history of present illness. No additional findings are noted on a 12-system review.  PHYSICAL EXAMINATION:  General:  This is a well-developed, well- nourished white female in no obvious distress.  Vital signs: Temperature of 98, pulse is 67, respiratory rate 16, blood pressure 115/69.  Weight is 164.  Head and neck:  Normocephalic, atraumatic skull.  There are no ocular or oral lesions.  There are no palpable cervical or supraclavicular lymph nodes.  Lungs:  Clear bilaterally. Cardiac:  Regular rate and rhythm with a normal S1 and S2.  There are no murmurs, rubs or bruits.  Abdomen:  Soft with good bowel sounds.  There is  no palpable abdominal mass.  There is no fluid wave.  There is no palpable hepatosplenomegaly.  Back exam does show the wide local excision scar in the right lower back.  She has a little bit of a keloid associated with this.  Extremities:  Show no clubbing, cyanosis or edema.  Neurological:  No focal neurological deficits.  Skin:  Does show some hyperpigmented lesions but none that look suspicious.  LABORATORY STUDIES:  White cell count is 11, hemoglobin 12.4, hematocrit 38.4, platelet count 278.  IMPRESSION:  Madeline Perkins is a very charming 27 year old white female. She has a stage IB (T1b N0 M0)  melanoma of the right lower back.  This was resected.  It certainly has good prognostic markers from my point of view.  It was only Clark level III invasion.  It had a low mitotic rate. There was no ulceration.  There was no vascular invasion.  I think the risk of her having a recurrence is going to be less than 10%.  I do not see a need for any additional studies.  I do not believe that she needs any additional x-ray tests done.  I think I probably would like to try to coordinate her appointments so that I alternate with Dr. Allyson Perkins.  He will see her in September.  I want see her 3 months after he sees her.  I think if we coordinate our appointments this way, she will have thorough followup.  She certainly is quite nice.  She understands very well what is going on and that she likely will have a 2nd melanoma before this melanoma that she had resected ever comes back.  She certainly is very "sun wise."  She is going to the beach in a couple of weeks.  She will make sure she wears a lot of sunscreen and will not get dehydrated.  I spent a good 45 minutes or so with Madeline Perkins.  Again, she is very nice.  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    ______________________________ Madeline Perkins, M.D. PRE/MEDQ  D:  10/27/2012  T:  10/28/2012  Job:  [id number]

## 2012-10-28 NOTE — Telephone Encounter (Signed)
Mailed December schedule

## 2013-01-17 NOTE — Progress Notes (Signed)
This encounter was created in error - please disregard.

## 2013-03-31 ENCOUNTER — Other Ambulatory Visit (HOSPITAL_BASED_OUTPATIENT_CLINIC_OR_DEPARTMENT_OTHER): Payer: 59 | Admitting: Lab

## 2013-03-31 ENCOUNTER — Ambulatory Visit (HOSPITAL_BASED_OUTPATIENT_CLINIC_OR_DEPARTMENT_OTHER): Payer: 59 | Admitting: Hematology & Oncology

## 2013-03-31 VITALS — BP 104/67 | HR 83 | Temp 98.1°F | Resp 14 | Ht 70.0 in | Wt 167.0 lb

## 2013-03-31 DIAGNOSIS — C4359 Malignant melanoma of other part of trunk: Secondary | ICD-10-CM

## 2013-03-31 LAB — COMPREHENSIVE METABOLIC PANEL
ALT: 11 U/L (ref 0–35)
AST: 13 U/L (ref 0–37)
Albumin: 4 g/dL (ref 3.5–5.2)
Alkaline Phosphatase: 34 U/L — ABNORMAL LOW (ref 39–117)
BUN: 12 mg/dL (ref 6–23)
CO2: 23 mEq/L (ref 19–32)
Calcium: 9.5 mg/dL (ref 8.4–10.5)
Chloride: 105 mEq/L (ref 96–112)
Creatinine, Ser: 0.9 mg/dL (ref 0.50–1.10)
Glucose, Bld: 108 mg/dL — ABNORMAL HIGH (ref 70–99)
Potassium: 4.1 mEq/L (ref 3.5–5.3)
Sodium: 138 mEq/L (ref 135–145)
Total Bilirubin: 0.2 mg/dL — ABNORMAL LOW (ref 0.3–1.2)
Total Protein: 6.6 g/dL (ref 6.0–8.3)

## 2013-03-31 LAB — CBC WITH DIFFERENTIAL (CANCER CENTER ONLY)
BASO#: 0 10*3/uL (ref 0.0–0.2)
Eosinophils Absolute: 0.2 10*3/uL (ref 0.0–0.5)
HGB: 12 g/dL (ref 11.6–15.9)
MCH: 26.7 pg (ref 26.0–34.0)
MONO%: 7.6 % (ref 0.0–13.0)
NEUT#: 5.1 10*3/uL (ref 1.5–6.5)
RBC: 4.49 10*6/uL (ref 3.70–5.32)

## 2013-03-31 LAB — LACTATE DEHYDROGENASE: LDH: 127 U/L (ref 94–250)

## 2013-03-31 NOTE — Progress Notes (Signed)
This office note has been dictated.

## 2013-04-02 NOTE — Progress Notes (Signed)
CC:   Madeline Masse, MD Madeline Perkins. Madeline Perkins, M.D.  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  CURRENT THERAPY:  Observation.  INTERIM HISTORY:  [name] comes in for followup.  She is doing well. We first saw her back in July.  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  As always, she is busy.  She recently saw Dr. Allyson Sabal.  He gave her a good evaluation and did not find anything that looked suspicious.  She has had no problems with cough or shortness of breath.  She has had no change in bowel or bladder habits.  She has had no problems with her monthly cycle.  She and her husband just got a puppy recently.  The puppy is 34 months old.  She has had no problems with cough.  There has been no rashes.  There has been no headaches.  PHYSICAL EXAMINATION:  General:  This is a well-developed, well- nourished white female, in no obvious distress.  Vital signs: Temperature of 98.1, pulse 83, respiratory rate 14, blood pressure 104/67, weight is 167 pounds.  Head and Neck:  Normocephalic, atraumatic skull.  There are no ocular or oral lesions.  There are no palpable cervical or supraclavicular lymph nodes.  Lungs:  Clear bilaterally. Cardiac:  Regular rate and rhythm with a normal S1, S2.  There are no murmurs, rubs, or bruits.  Abdomen:  Soft.  She has good bowel sounds. There is no fluid wave.  There is no palpable abdominal mass.  There is no palpable hepatosplenomegaly.  Back:  Shows a wide local excision scar in the right lower back.  The spot is at about the T12 level.  No tenderness is noted over the spine, ribs, or hips.  Extremities:  Show no clubbing, cyanosis, or edema.  Skin:  Shows several hyperpigmented lesions.  Nothing appears suspicious.  LABORATORY STUDIES:  White cell count 8.3, hemoglobin 12, hematocrit 37.4, platelet count 264,000.  IMPRESSION:  [name] is a 27 year old white female with  stage Ib melanoma of the right lower back.  She underwent wide local excision in April.  I think the risk of recurrence is easily going to be less than 10%.  I told her I do not see the need of any kind of x-rays that need to be done right now.  I will see her back in 3 months.  After that, I will see her back every 6 months, so that Dr. Allyson Sabal and I can alternate 3 month follow ups.    ______________________________ Volanda Napoleon, M.D. PRE/MEDQ  D:  03/31/2013  T:  04/01/2013  Job:  [id number]

## 2013-04-05 ENCOUNTER — Telehealth: Payer: Self-pay | Admitting: Nurse Practitioner

## 2013-04-05 NOTE — Telephone Encounter (Addendum)
Message copied by Glee Arvin on Tue Apr 05, 2013  8:39 AM ------      Message from: Josph Macho      Created: Sun Apr 03, 2013  8:37 PM       Call - labs are ok!  Pete ------Pt verbalized understanding and appreciation.

## 2013-04-23 ENCOUNTER — Emergency Department (HOSPITAL_COMMUNITY)
Admission: EM | Admit: 2013-04-23 | Discharge: 2013-04-23 | Disposition: A | Discharge status: Home / Self Care | Payer: 59 | Source: Home / Self Care | Attending: Family Medicine | Admitting: Family Medicine

## 2013-04-23 ENCOUNTER — Encounter (HOSPITAL_COMMUNITY): Payer: Self-pay | Admitting: Emergency Medicine

## 2013-04-23 DIAGNOSIS — J019 Acute sinusitis, unspecified: Secondary | ICD-10-CM

## 2013-04-23 MED ORDER — IPRATROPIUM BROMIDE 0.06 % NA SOLN: 2.0000 | Freq: Four times a day (QID) | NASAL | Status: DC

## 2013-04-23 MED ORDER — MINOCYCLINE HCL 100 MG PO CAPS: 100.0000 mg | ORAL_CAPSULE | Freq: Two times a day (BID) | ORAL | Status: DC

## 2013-04-23 NOTE — ED Provider Notes (Signed)
CSN: [id number]     Arrival date & time 04/23/13  1328 History   First MD Initiated Contact with Patient 04/23/13 1612     Chief Complaint  Patient presents with  . URI   (Consider location/radiation/quality/duration/timing/severity/associated sxs/prior Treatment) Patient is a 28 y.o. female presenting with URI. The history is provided by the patient.  URI Presenting symptoms: congestion, cough and rhinorrhea   Presenting symptoms: no fever   Severity:  Mild Onset quality:  Gradual Duration:  2 weeks Progression:  Unchanged Chronicity:  New Ineffective treatments:  OTC medications Risk factors: sick contacts     Past Medical History  Diagnosis Date  . GERD (gastroesophageal reflux disease)   . Cancer    Past Surgical History  Procedure Laterality Date  . Wisdom tooth extraction    . Breast enhancement surgery    . Wisdom tooth extraction  Nov 2009  . Breast surgery    . Laparoscopic appendectomy  05/03/2012    Procedure: APPENDECTOMY LAPAROSCOPIC;  Surgeon: Gwenyth Ober, MD;  Location: [place];  Service: General;  Laterality: N/A;  . Appendectomy    . Melanoma excision with sentinel lymph node biopsy N/A 08/10/2012    Procedure: RE-EXCISION BACK MELANOMA WITH SENTINEL LEFT INGUINAL LYMPH NODE BIOPSY;  Surgeon: Gwenyth Ober, MD;  Location: [facility];  Service: General;  Laterality: N/A;   Family History  Problem Relation Age of Onset  . Other Mother   . Cancer Mother     lymphoma  . Cancer Paternal Grandfather     esophageal   History  Substance Use Topics  . Smoking status: Never Smoker   . Smokeless tobacco: Not on file  . Alcohol Use: No   OB History   Grav Para Term Preterm Abortions TAB SAB Ect Mult Living                 Review of Systems  Constitutional: Negative.  Negative for fever.  HENT: Positive for congestion and rhinorrhea.   Respiratory: Positive for cough.     Allergies  Penicillins  Home Medications   Current Outpatient Rx  Name  Route   Sig  Dispense  Refill  . desogestrel-ethinyl estradiol (KARIVA,AZURETTE,MIRCETTE) 0.15-0.02/0.01 MG (21/5) tablet   Oral   Take 1 tablet by mouth daily.         Marland Kitchen ipratropium (ATROVENT) 0.06 % nasal spray   Nasal   Place 2 sprays into the nose 4 (four) times daily.   15 mL   1   . Iron 66 MG TABS   Oral   Take 1 tablet by mouth 4 (four) times a week.          . minocycline (MINOCIN,DYNACIN) 100 MG capsule   Oral   Take 1 capsule (100 mg total) by mouth 2 (two) times daily.   20 capsule   0   . Multiple Vitamins-Minerals (MULTIVITAMINS THER. W/MINERALS) TABS   Oral   Take 1 tablet by mouth daily.          BP 132/89  Pulse 74  Temp(Src) 98.2 F (36.8 C) (Oral)  Resp 18  SpO2 100%  LMP 04/01/2013 Physical Exam  Nursing note and vitals reviewed. Constitutional: She is oriented to person, place, and time. She appears well-developed and well-nourished. No distress.  HENT:  Head: Normocephalic.  Right Ear: External ear normal.  Left Ear: External ear normal.  Mouth/Throat: Oropharynx is clear and moist.  Eyes: Pupils are equal, round, and reactive to light.  Neck: Normal range of motion. Neck supple.  Pulmonary/Chest: Effort normal and breath sounds normal.  Abdominal: Soft. Bowel sounds are normal.  Neurological: She is alert and oriented to person, place, and time.  Skin: Skin is warm and dry.    ED Course  Procedures (including critical care time) Labs Review Labs Reviewed - No data to display Imaging Review No results found.  EKG Interpretation    Date/Time:    Ventricular Rate:    PR Interval:    QRS Duration:   QT Interval:    QTC Calculation:   R Axis:     Text Interpretation:              MDM      Billy Fischer, MD 04/23/13 [phone]

## 2013-04-23 NOTE — ED Notes (Signed)
Pt  Reports  Symptoms  Of  Cough   Congestion         X  2  Weeks  The     Cough  Is  Productive  For the  Most  Part            She  Is  Sitting upright on  Exam table  Speaking in  Complete  sentances

## 2013-04-23 NOTE — Discharge Instructions (Signed)
Drink plenty of fluids as discussed, use medicine as prescribed, and mucinex or delsym for cough. Return or see your doctor if further problems °

## 2013-07-01 ENCOUNTER — Other Ambulatory Visit: Payer: 59 | Admitting: Lab

## 2013-07-01 ENCOUNTER — Ambulatory Visit: Payer: 59 | Admitting: Hematology & Oncology

## 2013-07-01 ENCOUNTER — Telehealth: Payer: Self-pay | Admitting: Hematology & Oncology

## 2013-07-01 NOTE — Telephone Encounter (Signed)
Patient called and forgot she had an apt.  She cx 07/01/12 apt and resch for 07/04/13.  Manuela Schwartz was notified of cx apt

## 2013-07-04 ENCOUNTER — Encounter: Payer: Self-pay | Admitting: Hematology & Oncology

## 2013-07-04 ENCOUNTER — Ambulatory Visit (HOSPITAL_BASED_OUTPATIENT_CLINIC_OR_DEPARTMENT_OTHER): Payer: 59 | Admitting: Hematology & Oncology

## 2013-07-04 ENCOUNTER — Other Ambulatory Visit (HOSPITAL_BASED_OUTPATIENT_CLINIC_OR_DEPARTMENT_OTHER): Payer: 59 | Admitting: Lab

## 2013-07-04 VITALS — BP 124/77 | HR 64 | Temp 98.2°F | Resp 18 | Wt 160.0 lb

## 2013-07-04 DIAGNOSIS — C4359 Malignant melanoma of other part of trunk: Secondary | ICD-10-CM

## 2013-07-04 DIAGNOSIS — D649 Anemia, unspecified: Secondary | ICD-10-CM

## 2013-07-04 LAB — CBC WITH DIFFERENTIAL (CANCER CENTER ONLY)
BASO#: 0.1 10*3/uL (ref 0.0–0.2)
BASO%: 0.6 % (ref 0.0–2.0)
EOS%: 2.1 % (ref 0.0–7.0)
Eosinophils Absolute: 0.2 10*3/uL (ref 0.0–0.5)
HEMATOCRIT: 35.7 % (ref 34.8–46.6)
HEMOGLOBIN: 11.2 g/dL — AB (ref 11.6–15.9)
LYMPH#: 2.5 10*3/uL (ref 0.9–3.3)
LYMPH%: 30.4 % (ref 14.0–48.0)
MCH: 26 pg (ref 26.0–34.0)
MCHC: 31.4 g/dL — AB (ref 32.0–36.0)
MCV: 83 fL (ref 81–101)
MONO#: 0.8 10*3/uL (ref 0.1–0.9)
MONO%: 9.3 % (ref 0.0–13.0)
NEUT#: 4.7 10*3/uL (ref 1.5–6.5)
NEUT%: 57.6 % (ref 39.6–80.0)
Platelets: 278 10*3/uL (ref 145–400)
RBC: 4.31 10*6/uL (ref 3.70–5.32)
RDW: 13.3 % (ref 11.1–15.7)
WBC: 8.2 10*3/uL (ref 3.9–10.0)

## 2013-07-04 LAB — COMPREHENSIVE METABOLIC PANEL
ALBUMIN: 4.2 g/dL (ref 3.5–5.2)
ALK PHOS: 41 U/L (ref 39–117)
ALT: 21 U/L (ref 0–35)
AST: 21 U/L (ref 0–37)
BUN: 12 mg/dL (ref 6–23)
CHLORIDE: 103 meq/L (ref 96–112)
CO2: 26 meq/L (ref 19–32)
Calcium: 9.4 mg/dL (ref 8.4–10.5)
Creatinine, Ser: 0.91 mg/dL (ref 0.50–1.10)
GLUCOSE: 78 mg/dL (ref 70–99)
Potassium: 4.9 mEq/L (ref 3.5–5.3)
Sodium: 136 mEq/L (ref 135–145)
TOTAL PROTEIN: 6.8 g/dL (ref 6.0–8.3)
Total Bilirubin: 0.4 mg/dL (ref 0.2–1.2)

## 2013-07-04 LAB — LACTATE DEHYDROGENASE: LDH: 169 U/L (ref 94–250)

## 2013-07-04 NOTE — Progress Notes (Signed)
Hematology and Oncology Follow Up Visit  Madeline Perkins [id number] 21-Jan-1986 28 y.o. 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  Madeline Perkins is back for followup. Refer saw back in December. She is doing well. She is still working in radiology at [employer] had no problems with bleeding or bruising. She said a few weeks. There's been no cough. She's had no nausea vomiting. There's been no change in bowel or bladder habits.  Medications: Current outpatient prescriptions:calcium-vitamin D 250-100 MG-UNIT per tablet, Take 1 tablet by mouth daily., Disp: , Rfl: ;  Flaxseed, Linseed, (FLAX SEED OIL) 1000 MG CAPS, Take by mouth., Disp: , Rfl: ;  Iron 66 MG TABS, Take 1 tablet by mouth 4 (four) times a week. , Disp: , Rfl: ;  Multiple Vitamins-Minerals (MULTIVITAMINS THER. W/MINERALS) TABS, Take 1 tablet by mouth daily., Disp: , Rfl:  desogestrel-ethinyl estradiol (KARIVA,AZURETTE,MIRCETTE) 0.15-0.02/0.01 MG (21/5) tablet, Take 1 tablet by mouth daily., Disp: , Rfl:   Allergies:  Allergies  Allergen Reactions  . Penicillins Hives    Childhood allergy    Past Medical History, Surgical history, Social history, and Family History were reviewed and updated.  Review of Systems: Above  Physical Exam:  weight is 160 lb (72.576 kg). Her temperature is 98.2 F (36.8 C). Her blood pressure is 124/77 and her pulse is 64. Her respiration is 18.   Well-developed well-nourished. No lymphadenopathy. No suspicious skin lesions. She has a few hyperpigmented lesions but these do not appear suspicious. She has a wide local excision scar on the right lower back. Lungs are clear. Cardiac exam regular in rhythm. Abdomen is soft. No palpable liver or spleen to. Axillary exam shows no axillary lymph nodes. Extremities no clubbing cyanosis or edema. Neurological exam no focal  neurological deficits.  Lab Results  Component Value Date   WBC 8.2 07/04/2013   HGB 11.2* 07/04/2013   HCT 35.7 07/04/2013   MCV 83 07/04/2013   PLT 278 07/04/2013     Chemistry      Component Value Date/Time   NA 136 07/04/2013 1502   K 4.9 07/04/2013 1502   CL 103 07/04/2013 1502   CO2 26 07/04/2013 1502   BUN 12 07/04/2013 1502   CREATININE 0.91 07/04/2013 1502      Component Value Date/Time   CALCIUM 9.4 07/04/2013 1502   ALKPHOS 41 07/04/2013 1502   AST 21 07/04/2013 1502   ALT 21 07/04/2013 1502   BILITOT 0.4 07/04/2013 1502         Impression and Plan: Ms. Madeline Perkins is 28 year old white female. She had stage IB melanoma resected from the lower back. She has a very good prognosis. Risk of recurrence should be less than 10%. She had the excision back in April of 2014  We will get her back in 6 months now. She will see her dermatologist every 6 months but will try to alternate her visits so that one of [place] is seeing her every 3 months.  I do not see any indication for scans. Her labs look good and her physical exam is pretty much unremarkable.   Volanda Napoleon, MD 3/16/20156:52 PM

## 2013-07-05 ENCOUNTER — Telehealth: Payer: Self-pay | Admitting: *Deleted

## 2013-07-05 NOTE — Telephone Encounter (Signed)
Called patient to let her know that her labs looked ok

## 2013-07-05 NOTE — Telephone Encounter (Signed)
Message copied by Rico Ala on Tue Jul 05, 2013  1:30 PM ------      Message from: Volanda Napoleon      Created: Tue Jul 05, 2013  6:34 AM       Call - labs look ok!!  Laurey Arrow ------

## 2013-09-20 ENCOUNTER — Other Ambulatory Visit: Payer: Self-pay | Admitting: Physician Assistant

## 2014-01-04 ENCOUNTER — Other Ambulatory Visit: Payer: 59 | Admitting: Lab

## 2014-01-04 ENCOUNTER — Ambulatory Visit: Payer: 59 | Admitting: Hematology & Oncology

## 2014-01-17 ENCOUNTER — Emergency Department (INDEPENDENT_AMBULATORY_CARE_PROVIDER_SITE_OTHER)
Admission: EM | Admit: 2014-01-17 | Discharge: 2014-01-17 | Disposition: A | Discharge status: Home / Self Care | Payer: 59 | Source: Home / Self Care | Attending: Family Medicine | Admitting: Family Medicine

## 2014-01-17 ENCOUNTER — Encounter (HOSPITAL_COMMUNITY): Payer: Self-pay | Admitting: Emergency Medicine

## 2014-01-17 DIAGNOSIS — R42 Dizziness and giddiness: Secondary | ICD-10-CM

## 2014-01-17 LAB — POCT URINALYSIS DIP (DEVICE)
Bilirubin Urine: NEGATIVE
Glucose, UA: NEGATIVE mg/dL
Hgb urine dipstick: NEGATIVE
Ketones, ur: NEGATIVE mg/dL
Nitrite: NEGATIVE
PH: 6.5 (ref 5.0–8.0)
PROTEIN: NEGATIVE mg/dL
UROBILINOGEN UA: 0.2 mg/dL (ref 0.0–1.0)

## 2014-01-17 LAB — POCT PREGNANCY, URINE: PREG TEST UR: NEGATIVE

## 2014-01-17 LAB — GLUCOSE, CAPILLARY: GLUCOSE-CAPILLARY: 72 mg/dL (ref 70–99)

## 2014-01-17 MED ORDER — GLUCOSE-VITAMIN C 4-6 GM-MG PO CHEW
CHEWABLE_TABLET | ORAL | Status: AC
  Filled 2014-01-17: qty 2

## 2014-01-17 MED ORDER — ONDANSETRON 4 MG PO TBDP
4.0000 mg | ORAL_TABLET | Freq: Once | ORAL | Status: AC
  Administered 2014-01-17: 4 mg via ORAL

## 2014-01-17 MED ORDER — MECLIZINE HCL 50 MG PO TABS: 25.0000 mg | ORAL_TABLET | Freq: Two times a day (BID) | ORAL | Status: DC | PRN

## 2014-01-17 MED ORDER — ONDANSETRON 4 MG PO TBDP: 4.0000 mg | ORAL_TABLET | Freq: Three times a day (TID) | ORAL | Status: DC | PRN

## 2014-01-17 MED ORDER — ONDANSETRON 4 MG PO TBDP
ORAL_TABLET | ORAL | Status: AC
  Filled 2014-01-17: qty 1

## 2014-01-17 NOTE — ED Notes (Signed)
Pt states that she was working and a sudden feeling of dizziness came over here. Its very hard for her to look up straight without getting nauseous. Pt states that she is not having any pain at this time. VSS. Pt in no acute distress at this time.

## 2014-01-17 NOTE — Discharge Instructions (Signed)
The cause of your dizziness and nausea are likely from a combination of low sugar, stress, dehydration, and a possible early viral illness. This may also be the beginnings of a viral illness. Please go home and rest. Try to make sure to eat every 2 hours, and stay well hydrated.  Use the zofran as needed for nausea The Antivert may be used for nausea and dizziness.  There is no sign of inner ear abnormalities or cardiac cause of your symptoms.  Please go to the emergency room if you get significantly worse.

## 2014-01-17 NOTE — ED Provider Notes (Signed)
CSN: [id number]     Arrival date & time 01/17/14  1223 History   First MD Initiated Contact with Patient 01/17/14 1302     Chief Complaint  Patient presents with  . Dizziness   (Consider location/radiation/quality/duration/timing/severity/associated sxs/prior Treatment) HPI  Acute onset of dizziness about 1 hour ago. 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 No vertigo. Worse w/ looking up. Improves w/ looking down. Has never had this happen before. Had small cereal bowl this morning around 9am. Associated w/ nausea when looking up. Denies HA, CP, SOB. Denies recent fevers, diarrhea, rash. LMP 3 wks ago. Not on contraceptive. No sick contacts.    Past Medical History  Diagnosis Date  . GERD (gastroesophageal reflux disease)   . Cancer     melanoma   Past Surgical History  Procedure Laterality Date  . Wisdom tooth extraction    . Breast enhancement surgery    . Wisdom tooth extraction  Nov 2009  . Breast surgery    . Laparoscopic appendectomy  05/03/2012    Procedure: APPENDECTOMY LAPAROSCOPIC;  Surgeon: Gwenyth Ober, MD;  Location: [place];  Service: General;  Laterality: N/A;  . Appendectomy    . Melanoma excision with sentinel lymph node biopsy N/A 08/10/2012    Procedure: RE-EXCISION BACK MELANOMA WITH SENTINEL LEFT INGUINAL LYMPH NODE BIOPSY;  Surgeon: Gwenyth Ober, MD;  Location: [facility];  Service: General;  Laterality: N/A;   Family History  Problem Relation Age of Onset  . Other Mother   . Cancer Mother     lymphoma  . Cancer Paternal Grandfather     esophageal   History  Substance Use Topics  . Smoking status: Never Smoker   . Smokeless tobacco: Not on file  . Alcohol Use: No   OB History   Grav Para Term Preterm Abortions TAB SAB Ect Mult Living                 Review of Systems Per HPI with all other pertinent systems negative.   Allergies  Penicillins  Home Medications   Prior to Admission medications   Medication Sig Start Date End  Date Taking? Authorizing Provider  calcium-vitamin D 250-100 MG-UNIT per tablet Take 1 tablet by mouth daily.   Yes Historical Provider, MD  desogestrel-ethinyl estradiol (KARIVA,AZURETTE,MIRCETTE) 0.15-0.02/0.01 MG (21/5) tablet Take 1 tablet by mouth daily.   Yes Historical Provider, MD  Flaxseed, Linseed, (FLAX SEED OIL) 1000 MG CAPS Take by mouth.   Yes Historical Provider, MD  Iron 66 MG TABS Take 1 tablet by mouth 4 (four) times a week.    Yes Historical Provider, MD  Multiple Vitamins-Minerals (MULTIVITAMINS THER. W/MINERALS) TABS Take 1 tablet by mouth daily.   Yes Historical Provider, MD  meclizine (ANTIVERT) 50 MG tablet Take 0.5-1 tablets (25-50 mg total) by mouth 2 (two) times daily as needed. 01/17/14   Waldemar Dickens, MD  ondansetron (ZOFRAN-ODT) 4 MG disintegrating tablet Take 1 tablet (4 mg total) by mouth every 8 (eight) hours as needed for nausea or vomiting. 01/17/14   Waldemar Dickens, MD   BP 130/72  Pulse 81  Temp(Src) 98.1 F (36.7 C) (Oral)  Resp 16  SpO2 100%  LMP 12/26/2013 Physical Exam  Constitutional: She is oriented to person, place, and time. She appears well-developed and well-nourished. No distress.  HENT:  Head: Normocephalic and atraumatic.  Eyes: EOM are normal. Pupils are equal, round, and reactive to light.  Neck: Normal range of motion.  Cardiovascular:  Normal rate, normal heart sounds and intact distal pulses.   No murmur heard. Pulmonary/Chest: Effort normal and breath sounds normal.  Musculoskeletal: Normal range of motion.  Neurological: She is alert and oriented to person, place, and time. No cranial nerve deficit. She exhibits normal muscle tone. Coordination normal.  Marye Round equivocal  Skin: Skin is warm and dry. No rash noted. She is not diaphoretic. No erythema. No pallor.  Psychiatric: She has a normal mood and affect. Her behavior is normal. Judgment and thought content normal.    ED Course  Procedures (including critical care  time) Labs Review Labs Reviewed  POCT URINALYSIS DIP (DEVICE) - Abnormal; Notable for the following:    Leukocytes, UA TRACE (*)    All other components within normal limits  GLUCOSE, CAPILLARY  POCT PREGNANCY, URINE   Orthostatic VS for the past 24 hrs:  BP- Lying Pulse- Lying BP- Sitting Pulse- Sitting BP- Standing at 0 minutes Pulse- Standing at 0 minutes  01/17/14 1323 135/91 mmHg 73 124/84 mmHg 76 135/86 mmHg 74   CBG today 72    Imaging Review No results found.   MDM   1. Dizziness    Etiology unclear but may be due to a combination including hypoglycemia (CBG 72), stress, dehydration, or the beginnings of a viral illness or migraine. No sign of intracranial process. BPPV unlikely. Improvement after Zofran 51m PO ODT and 2 glucose tabs w/ other foods adn fluids.  Zofran and Antivert Rx given.  Precautions given and all questions answered  DLinna Darner MD Family Medicine 01/17/2014, 2:31 PM      DWaldemar Dickens MD 01/17/14 [phone]

## 2014-05-25 ENCOUNTER — Emergency Department (HOSPITAL_COMMUNITY): Payer: 59

## 2014-05-25 ENCOUNTER — Emergency Department (HOSPITAL_COMMUNITY)
Admission: EM | Admit: 2014-05-25 | Discharge: 2014-05-26 | Disposition: A | Discharge status: Home / Self Care | Payer: 59 | Attending: Emergency Medicine | Admitting: Emergency Medicine

## 2014-05-25 ENCOUNTER — Encounter (HOSPITAL_COMMUNITY): Payer: Self-pay | Admitting: *Deleted

## 2014-05-25 DIAGNOSIS — R000 Tachycardia, unspecified: Secondary | ICD-10-CM | POA: Insufficient documentation

## 2014-05-25 DIAGNOSIS — Z8719 Personal history of other diseases of the digestive system: Secondary | ICD-10-CM | POA: Diagnosis not present

## 2014-05-25 DIAGNOSIS — Z3202 Encounter for pregnancy test, result negative: Secondary | ICD-10-CM | POA: Diagnosis not present

## 2014-05-25 DIAGNOSIS — Z880 Allergy status to penicillin: Secondary | ICD-10-CM | POA: Insufficient documentation

## 2014-05-25 DIAGNOSIS — R110 Nausea: Secondary | ICD-10-CM | POA: Insufficient documentation

## 2014-05-25 DIAGNOSIS — Z85820 Personal history of malignant melanoma of skin: Secondary | ICD-10-CM | POA: Diagnosis not present

## 2014-05-25 DIAGNOSIS — Z79899 Other long term (current) drug therapy: Secondary | ICD-10-CM | POA: Insufficient documentation

## 2014-05-25 DIAGNOSIS — R0602 Shortness of breath: Secondary | ICD-10-CM | POA: Diagnosis not present

## 2014-05-25 DIAGNOSIS — R002 Palpitations: Secondary | ICD-10-CM | POA: Diagnosis not present

## 2014-05-25 DIAGNOSIS — R61 Generalized hyperhidrosis: Secondary | ICD-10-CM | POA: Diagnosis not present

## 2014-05-25 LAB — CBC WITH DIFFERENTIAL/PLATELET
BASOS PCT: 1 % (ref 0–1)
Basophils Absolute: 0.1 10*3/uL (ref 0.0–0.1)
EOS PCT: 3 % (ref 0–5)
Eosinophils Absolute: 0.4 10*3/uL (ref 0.0–0.7)
HCT: 39.5 % (ref 36.0–46.0)
HEMOGLOBIN: 12.5 g/dL (ref 12.0–15.0)
Lymphocytes Relative: 31 % (ref 12–46)
Lymphs Abs: 3.4 10*3/uL (ref 0.7–4.0)
MCH: 26.6 pg (ref 26.0–34.0)
MCHC: 31.6 g/dL (ref 30.0–36.0)
MCV: 84 fL (ref 78.0–100.0)
Monocytes Absolute: 1 10*3/uL (ref 0.1–1.0)
Monocytes Relative: 9 % (ref 3–12)
NEUTROS ABS: 6.3 10*3/uL (ref 1.7–7.7)
Neutrophils Relative %: 56 % (ref 43–77)
Platelets: 278 10*3/uL (ref 150–400)
RBC: 4.7 MIL/uL (ref 3.87–5.11)
RDW: 13.3 % (ref 11.5–15.5)
WBC: 11.1 10*3/uL — ABNORMAL HIGH (ref 4.0–10.5)

## 2014-05-25 LAB — BASIC METABOLIC PANEL
ANION GAP: 2 — AB (ref 5–15)
BUN: 15 mg/dL (ref 6–23)
CALCIUM: 9.3 mg/dL (ref 8.4–10.5)
CO2: 29 mmol/L (ref 19–32)
Chloride: 108 mmol/L (ref 96–112)
Creatinine, Ser: 0.87 mg/dL (ref 0.50–1.10)
GFR calc Af Amer: >90 mL/min (ref >90)
GFR, EST NON AFRICAN AMERICAN: 90 mL/min — AB (ref >90)
Glucose, Bld: 86 mg/dL (ref 70–99)
Potassium: 4.3 mmol/L (ref 3.5–5.1)
Sodium: 139 mmol/L (ref 135–145)

## 2014-05-25 LAB — POC URINE PREG, ED: Preg Test, Ur: NEGATIVE

## 2014-05-25 LAB — TROPONIN I

## 2014-05-25 NOTE — ED Provider Notes (Addendum)
CSN: [id number]     Arrival date & time 05/25/14  2016 History  This chart was scribed for Janice Norrie, MD by Edison Simon, ED Scribe. This patient was seen in room APA02/APA02 and the patient's care was started at 12:28 AM.    Chief Complaint  Patient presents with  . Tachycardia   The history is provided by the patient. No language interpreter was used.    HPI Comments: Madeline Perkins is a 29 y.o. female who presents to the Emergency Department complaining of tachycardia with onset approximately 24 hours ago. She states she was just walking around the house when it began. She states it lasted a few hours. She reports associated mid-chest discomfort, described as tightness, but denies pain. She reports associated diaphoresis and nausea last night. She states symptoms resolved spontaneously. She states she has never had similar symptoms before. She states symptoms recurred at 1800 today while standing and walking around at work with associated SOB, lasting 2.5 hours. She states she feels like symptoms will recur if she gets up and walks around at this time. She denies recent weight loss or diarrhea and states she felt at baseline until these symptoms began. She denies FHx of cardiac disease or abnormal heart rhythms. She states her father has hypothyroidism. She denies smoking or drinking. She states the only medication she uses is [place]. 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 She denies vomiting or leg swelling or pain.  PCP: Manon Hilding, MD   Past Medical History  Diagnosis Date  . GERD (gastroesophageal reflux disease)   . Cancer     melanoma   Past Surgical History  Procedure Laterality Date  . Wisdom tooth extraction    . Breast enhancement surgery    . Wisdom tooth extraction  Nov 2009  . Laparoscopic appendectomy  05/03/2012    Procedure: APPENDECTOMY LAPAROSCOPIC;  Surgeon: Gwenyth Ober, MD;  Location: [place];  Service: General;  Laterality: N/A;  . Appendectomy    . Melanoma  excision with sentinel lymph node biopsy N/A 08/10/2012    Procedure: RE-EXCISION BACK MELANOMA WITH SENTINEL LEFT INGUINAL LYMPH NODE BIOPSY;  Surgeon: Gwenyth Ober, MD;  Location: [place];  Service: General;  Laterality: N/A;  . Breast surgery     Family History  Problem Relation Age of Onset  . Other Mother   . Cancer Mother     lymphoma  . Cancer Paternal Grandfather     esophageal   History  Substance Use Topics  . Smoking status: Never Smoker   . Smokeless tobacco: Not on file  . Alcohol Use: No   Employed as Garment/textile technologist at [employer]  OB History    No data available     Review of Systems  Constitutional: Positive for diaphoresis. Negative for unexpected weight change.  Respiratory: Positive for chest tightness and shortness of breath.   Cardiovascular: Positive for palpitations. Negative for leg swelling.  Gastrointestinal: Positive for nausea. Negative for vomiting and diarrhea.  All other systems reviewed and are negative.     Allergies  Penicillins  Home Medications   Prior to Admission medications   Medication Sig Start Date End Date Taking? Authorizing Provider  calcium-vitamin D 250-100 MG-UNIT per tablet Take 1 tablet by mouth daily.    Historical Provider, MD  desogestrel-ethinyl estradiol (KARIVA,AZURETTE,MIRCETTE) 0.15-0.02/0.01 MG (21/5) tablet Take 1 tablet by mouth daily.    Historical Provider, MD  Flaxseed, Linseed, (FLAX SEED OIL) 1000 MG CAPS Take  by mouth.    Historical Provider, MD  Iron 66 MG TABS Take 1 tablet by mouth 4 (four) times a week.     Historical Provider, MD  meclizine (ANTIVERT) 50 MG tablet Take 0.5-1 tablets (25-50 mg total) by mouth 2 (two) times daily as needed. 01/17/14   Waldemar Dickens, MD  Multiple Vitamins-Minerals (MULTIVITAMINS THER. W/MINERALS) TABS Take 1 tablet by mouth daily.    Historical Provider, MD  ondansetron (ZOFRAN-ODT) 4 MG disintegrating tablet Take 1 tablet (4 mg total) by mouth every 8 (eight) hours as needed for  nausea or vomiting. 01/17/14   Waldemar Dickens, MD   BP 118/79 mmHg  Pulse 78  Temp(Src) 97.9 F (36.6 C) (Oral)  Resp 18  Ht 5\' 10"  (1.778 m)  Wt 165 lb (74.844 kg)  BMI 23.68 kg/m2  SpO2 100%  LMP 04/20/2014  Vital signs normal   Physical Exam  Constitutional: She is oriented to person, place, and time. She appears well-developed and well-nourished.  Non-toxic appearance. She does not appear ill. No distress.  HENT:  Head: Normocephalic and atraumatic.  Right Ear: External ear normal.  Left Ear: External ear normal.  Nose: Nose normal. No mucosal edema or rhinorrhea.  Mouth/Throat: Oropharynx is clear and moist and mucous membranes are normal. No dental abscesses or uvula swelling.  Eyes: Conjunctivae and EOM are normal. Pupils are equal, round, and reactive to light.  Neck: Normal range of motion and full passive range of motion without pain. Neck supple. No thyroid mass and no thyromegaly present.  Thyroid is normal sized  Cardiovascular: Regular rhythm and normal heart sounds.  Tachycardia present.  Exam reveals no gallop and no friction rub.   No murmur heard. Pulmonary/Chest: Effort normal and breath sounds normal. No respiratory distress. She has no wheezes. She has no rhonchi. She has no rales. She exhibits no tenderness and no crepitus.  Abdominal: Soft. Normal appearance and bowel sounds are normal. She exhibits no distension. There is no tenderness. There is no rebound and no guarding.  Musculoskeletal: Normal range of motion. She exhibits no edema or tenderness.  Moves all extremities well.   Neurological: She is alert and oriented to person, place, and time. She has normal strength. No cranial nerve deficit.  Skin: Skin is warm, dry and intact. No rash noted. No erythema. No pallor.  Psychiatric: She has a normal mood and affect. Her speech is normal and behavior is normal. Her mood appears not anxious.  Nursing note and vitals reviewed.   ED Course  Procedures  (including critical care time)  DIAGNOSTIC STUDIES: Oxygen Saturation is 100% on room air, normal by my interpretation.    COORDINATION OF CARE: 12:36 AM Discussed treatment plan with patient at beside, including thyroid and Mg tests. The patient agrees with the plan and has no further questions at this time.  Patient ambulated by nursing staff to report her heart rate was 90-100. Pulse ox was 100%.  I was going to discharge patient to follow-up with cardiology however she then states she has been having shortness of breath constantly for the past 24 hours. I had thought during my initial interview she only had the shortness of breath when she felt her heart was racing. CT angiogram of chest was ordered. Patient is at risk for PE due to her being on birth control pills.  Patient given the results of her CT scan which did not show any PE.  Labs Review Results for orders placed or performed  during the hospital encounter of 05/25/14  CBC with Differential  Result Value Ref Range   WBC 11.1 (H) 4.0 - 10.5 K/uL   RBC 4.70 3.87 - 5.11 MIL/uL   Hemoglobin 12.5 12.0 - 15.0 g/dL   HCT 39.5 36.0 - 46.0 %   MCV 84.0 78.0 - 100.0 fL   MCH 26.6 26.0 - 34.0 pg   MCHC 31.6 30.0 - 36.0 g/dL   RDW 13.3 11.5 - 15.5 %   Platelets 278 150 - 400 K/uL   Neutrophils Relative % 56 43 - 77 %   Neutro Abs 6.3 1.7 - 7.7 K/uL   Lymphocytes Relative 31 12 - 46 %   Lymphs Abs 3.4 0.7 - 4.0 K/uL   Monocytes Relative 9 3 - 12 %   Monocytes Absolute 1.0 0.1 - 1.0 K/uL   Eosinophils Relative 3 0 - 5 %   Eosinophils Absolute 0.4 0.0 - 0.7 K/uL   Basophils Relative 1 0 - 1 %   Basophils Absolute 0.1 0.0 - 0.1 K/uL  Basic metabolic panel  Result Value Ref Range   Sodium 139 135 - 145 mmol/L   Potassium 4.3 3.5 - 5.1 mmol/L   Chloride 108 96 - 112 mmol/L   CO2 29 19 - 32 mmol/L   Glucose, Bld 86 70 - 99 mg/dL   BUN 15 6 - 23 mg/dL   Creatinine, Ser 0.87 0.50 - 1.10 mg/dL   Calcium 9.3 8.4 - 10.5 mg/dL   GFR  calc non Af Amer 90 (L) >90 mL/min   GFR calc Af Amer >90 >90 mL/min   Anion gap 2 (L) 5 - 15  Troponin I  Result Value Ref Range   Troponin I <0.03 <0.031 ng/mL  TSH  Result Value Ref Range   TSH 1.852 0.350 - 4.500 uIU/mL  Magnesium  Result Value Ref Range   Magnesium 2.0 1.5 - 2.5 mg/dL  POC Urine Pregnancy, ED (do NOT order at [facility])  Result Value Ref Range   Preg Test, Ur NEGATIVE NEGATIVE    Laboratory interpretation all normal except thyroid tests pending    Imaging Review Dg Chest 2 View  05/25/2014   CLINICAL DATA:  Shortness of breath.  Tachycardia.  EXAM: CHEST  2 VIEW  COMPARISON:  February 19, 2006.  FINDINGS: The heart size and mediastinal contours are within normal limits. Both lungs are clear. No pneumothorax or pleural effusion is noted. The visualized skeletal structures are unremarkable.  IMPRESSION: No acute cardiopulmonary abnormality seen.   Electronically Signed   By: Sabino Dick M.D.   On: 05/25/2014 20:51   Ct Angio Chest Aorta W/cm &/or Wo/cm  05/26/2014   CLINICAL DATA:  Tachycardia and shortness of breath. On oral contraceptives. Symptoms for 1 day.  EXAM: CT ANGIOGRAPHY CHEST WITH CONTRAST  TECHNIQUE: Multidetector CT imaging of the chest was performed using the standard protocol during bolus administration of intravenous contrast. Multiplanar CT image reconstructions and MIPs were obtained to evaluate the vascular anatomy.  CONTRAST:  145mL OMNIPAQUE IOHEXOL 350 MG/ML SOLN  COMPARISON:  Chest radiograph 1 day prior.  FINDINGS: There are no filling defects within the pulmonary arteries to suggest pulmonary embolus. The thoracic aorta is normal in caliber without dissection or aneurysm.  The heart size is normal. There is no pleural or pericardial effusion. No mediastinal or hilar adenopathy. No axillary adenopathy.  The lungs are clear. There is no consolidation, pulmonary nodule or mass.  The upper abdomen is normal. Punctate bone  island in the mid thoracic spine.  There are no acute or suspicious osseous abnormalities.  Review of the MIP images confirms the above findings.  IMPRESSION: 1. No pulmonary embolus. 2. No acute intrathoracic process.   Electronically Signed   By: Jeb Levering M.D.   On: 05/26/2014 03:14     EKG Interpretation   Date/Time:  Thursday May 25 2014 20:26:47 EST Ventricular Rate:  74 PR Interval:  120 QRS Duration: 82 QT Interval:  384 QTC Calculation: 426 R Axis:   77 Text Interpretation:  Normal sinus rhythm Normal ECG No old tracing to  compare Confirmed by Sharnell Knight  MD-I, Aristide Waggle ([id number]) on 05/25/2014 11:01:17 PM      MDM  patient has had 2 episodes in the past 24 hours of tachycardia and fortunately she has not had it while in the ED. Her laboratory results are normal with thyroid test pending. She was given metoprolol to uses a when necessary basis for help dictations and referral to cardiology.    Final diagnoses:  Tachycardia    New Prescriptions   METOPROLOL (LOPRESSOR) 25 MG TABLET    Take 1 tablet (25 mg total) by mouth once as needed (racing heart).    Plan discharge  Rolland Porter, MD, FACEP   I personally performed the services described in this documentation, which was scribed in my presence. The recorded information has been reviewed and considered.  Rolland Porter, MD, [place], MD 05/26/14 [place], MD 05/26/14 [phone]

## 2014-05-25 NOTE — ED Notes (Signed)
EKG performed in Triage.

## 2014-05-25 NOTE — ED Notes (Signed)
Tachycardia  onset app 12 noon,  Says heart rate has been up to 130's, felt sob and chest tight

## 2014-05-26 ENCOUNTER — Emergency Department (HOSPITAL_COMMUNITY): Payer: 59

## 2014-05-26 LAB — T4, FREE: FREE T4: 1.06 ng/dL (ref 0.80–1.80)

## 2014-05-26 LAB — MAGNESIUM: MAGNESIUM: 2 mg/dL (ref 1.5–2.5)

## 2014-05-26 LAB — TSH: TSH: 1.852 u[IU]/mL (ref 0.350–4.500)

## 2014-05-26 MED ORDER — IOHEXOL 350 MG/ML SOLN
100.0000 mL | Freq: Once | INTRAVENOUS | Status: AC | PRN
  Administered 2014-05-26: 100 mL via INTRAVENOUS

## 2014-05-26 MED ORDER — METOPROLOL TARTRATE 25 MG PO TABS: 25.0000 mg | ORAL_TABLET | Freq: Once | ORAL | Status: DC | PRN

## 2014-05-26 MED ORDER — METOPROLOL TARTRATE 25 MG PO TABS
25.0000 mg | ORAL_TABLET | Freq: Once | ORAL | Status: AC
  Administered 2014-05-26: 25 mg via ORAL
  Filled 2014-05-26: qty 1

## 2014-05-26 NOTE — ED Notes (Signed)
Pt ambulated in hallway with pulse ox. O2 Saturation 100% for duration of walk. HR varied between 100-90 and trended down during walk.

## 2014-05-26 NOTE — ED Notes (Signed)
MD at bedside. 

## 2014-05-26 NOTE — Discharge Instructions (Signed)
Avoid caffeine. Take the metoprolol if you feel your heart is racing. Call the Cardiologists office to get an appointment to have further evaluation of your racing heart. Have your doctor recheck your thyroid tests next week.  Return to the ED if you get light headed, chest pain, struggle to breathe or feel worse.    Nonspecific Tachycardia Tachycardia is a faster than normal heartbeat (more than 100 beats per minute). In adults, the heart normally beats between 60 and 100 times a minute. A fast heartbeat may be a normal response to exercise or stress. It does not necessarily mean that something is wrong. However, sometimes when your heart beats too fast it may not be able to pump enough blood to the rest of your body. This can result in chest pain, shortness of breath, dizziness, and even fainting. Nonspecific tachycardia means that the specific cause or pattern of your tachycardia is unknown. CAUSES  Tachycardia may be harmless or it may be due to a more serious underlying cause. Possible causes of tachycardia include:  Exercise or exertion.  Fever.  Pain or injury.  Infection.  Loss of body fluids (dehydration).  Overactive thyroid.  Lack of red blood cells (anemia).  Anxiety and stress.  Alcohol.  Caffeine.  Tobacco products.  Diet pills.  Illegal drugs.  Heart disease. SYMPTOMS  Rapid or irregular heartbeat (palpitations).  Suddenly feeling your heart beating (cardiac awareness).  Dizziness.  Tiredness (fatigue).  Shortness of breath.  Chest pain.  Nausea.  Fainting. DIAGNOSIS  Your caregiver will perform a physical exam and take your medical history. In some cases, a heart specialist (cardiologist) may be consulted. Your caregiver may also order:  Blood tests.  Electrocardiography. This test records the electrical activity of your heart.  A heart monitoring test. TREATMENT  Treatment will depend on the likely cause of your tachycardia. The goal is to  treat the underlying cause of your tachycardia. Treatment methods may include:  Replacement of fluids or blood through an intravenous (IV) tube for moderate to severe dehydration or anemia.  New medicines or changes in your current medicines.  Diet and lifestyle changes.  Treatment for certain infections.  Stress relief or relaxation methods. HOME CARE INSTRUCTIONS   Rest.  Drink enough fluids to keep your urine clear or pale yellow.  Do not smoke.  Avoid:  Caffeine.  Tobacco.  Alcohol.  Chocolate.  Stimulants such as over-the-counter diet pills or pills that help you stay awake.  Situations that cause anxiety or stress.  Illegal drugs such as marijuana, phencyclidine (PCP), and cocaine.  Only take medicine as directed by your caregiver.  Keep all follow-up appointments as directed by your caregiver. SEEK IMMEDIATE MEDICAL CARE IF:   You have pain in your chest, upper arms, jaw, or neck.  You become weak, dizzy, or feel faint.  You have palpitations that will not go away.  You vomit, have diarrhea, or pass blood in your stool.  Your skin is cool, pale, and wet.  You have a fever that will not go away with rest, fluids, and medicine. MAKE SURE YOU:   Understand these instructions.  Will watch your condition.  Will get help right away if you are not doing well or get worse. Document Released: 05/15/2004 Document Revised: 06/30/2011 Document Reviewed: 03/18/2011 [facility] Patient Information 2015 [place], [place]. This information is not intended to replace advice given to you by your health care provider. Make sure you discuss any questions you have with your health care provider.

## 2014-07-08 NOTE — Progress Notes (Signed)
HPI: 29 yo female for evaluation of tachycardia. CTA 2/16 showed no pulmonary embolus. Labs 2/16 showed normal TSH and Hgb. Patient states she has had episodes of tachycardia for approximately 2-1/2 months. They can occur at rest but she has also noticed this occasionally with exercise. She describes her heart as racing. She has checked her pulse and at times it is over 100 but she has felt it at 180 previously as well. There is associated chest tightness. Symptoms last less than 5 minutes and resolve spontaneously. She has had minimal dizziness but no syncope. Otherwise she does not have dyspnea on exertion, orthopnea, PND, pedal edema or exertional chest pain.  Current Outpatient Prescriptions  Medication Sig Dispense Refill  . calcium-vitamin D 250-100 MG-UNIT per tablet Take 1 tablet by mouth daily.    Marland Kitchen desogestrel-ethinyl estradiol (KARIVA,AZURETTE,MIRCETTE) 0.15-0.02/0.01 MG (21/5) tablet Take 1 tablet by mouth daily.    . Flaxseed, Linseed, (FLAX SEED OIL) 1000 MG CAPS Take by mouth.    . Iron 66 MG TABS Take 1 tablet by mouth 4 (four) times a week.     . meclizine (ANTIVERT) 50 MG tablet Take 0.5-1 tablets (25-50 mg total) by mouth 2 (two) times daily as needed. 30 tablet 0  . Multiple Vitamins-Minerals (MULTIVITAMINS THER. W/MINERALS) TABS Take 1 tablet by mouth daily.     No current facility-administered medications for this visit.    Allergies  Allergen Reactions  . Penicillins Hives    Childhood allergy     Past Medical History  Diagnosis Date  . GERD (gastroesophageal reflux disease)   . Cancer     melanoma    Past Surgical History  Procedure Laterality Date  . Breast enhancement surgery    . Wisdom tooth extraction  Nov 2009  . Laparoscopic appendectomy  05/03/2012    Procedure: APPENDECTOMY LAPAROSCOPIC;  Surgeon: Gwenyth Ober, MD;  Location: [place];  Service: General;  Laterality: N/A;  . Melanoma excision with sentinel lymph node biopsy N/A 08/10/2012   Procedure: RE-EXCISION BACK MELANOMA WITH SENTINEL LEFT INGUINAL LYMPH NODE BIOPSY;  Surgeon: Gwenyth Ober, MD;  Location: [place];  Service: General;  Laterality: N/A;    History   Social History  . Marital Status: Married    Spouse Name: N/A  . Number of Children: N/A  . Years of Education: N/A   Occupational History  .      [employer] technician   Social History Main Topics  . Smoking status: Never Smoker   . Smokeless tobacco: Not on file  . Alcohol Use: 0.0 oz/week    0 Standard drinks or equivalent per week     Comment: Occasional  . Drug Use: No  . Sexual Activity: Yes    Birth Control/ Protection: Pill   Other Topics Concern  . Not on file   Social History Narrative    Family History  Problem Relation Age of Onset  . Other Mother   . Cancer Mother     lymphoma  . Cancer Paternal Grandfather     esophageal  . Hypertension Mother   . Hypertension Father     ROS: no fevers or chills, productive cough, hemoptysis, dysphasia, odynophagia, melena, hematochezia, dysuria, hematuria, rash, seizure activity, orthopnea, PND, pedal edema, claudication. Remaining systems are negative.  Physical Exam:   Blood pressure 102/50, pulse 70, height 5' 10"  (1.778 m), weight 165 lb 9.6 oz (75.116 kg).  General:  Well developed/well nourished in NAD Skin warm/dry  Patient not depressed No peripheral clubbing Back-normal HEENT-normal/normal eyelids Neck supple/normal carotid upstroke bilaterally; no bruits; no JVD; no thyromegaly chest - CTA/ normal expansion CV - RRR/normal S1 and S2; no murmurs, rubs or gallops;  PMI nondisplaced Abdomen -NT/ND, no HSM, no mass, + bowel sounds, no bruit 2+ femoral pulses, no bruits Ext-no edema, chords, 2+ DP Neuro-grossly nonfocal  ECG 05/25/14, NSR, no ST changes.

## 2014-07-10 ENCOUNTER — Encounter: Payer: Self-pay | Admitting: Cardiology

## 2014-07-10 ENCOUNTER — Ambulatory Visit (INDEPENDENT_AMBULATORY_CARE_PROVIDER_SITE_OTHER): Payer: 59 | Admitting: Cardiology

## 2014-07-10 ENCOUNTER — Encounter: Payer: Self-pay | Admitting: *Deleted

## 2014-07-10 VITALS — BP 102/50 | HR 70 | Ht 70.0 in | Wt 165.6 lb

## 2014-07-10 DIAGNOSIS — Z09 Encounter for follow-up examination after completed treatment for conditions other than malignant neoplasm: Secondary | ICD-10-CM

## 2014-07-10 DIAGNOSIS — R079 Chest pain, unspecified: Secondary | ICD-10-CM

## 2014-07-10 DIAGNOSIS — R002 Palpitations: Secondary | ICD-10-CM

## 2014-07-10 NOTE — Assessment & Plan Note (Signed)
Etiology unclear. Electrocardiogram normal. TSH and hemoglobin normal. Plan echocardiogram to assess LV function. She has some symptoms with exercise and states her heart rate will be 180. I will arrange an exercise treadmill to assess for exercise-induced arrhythmias. We will also schedule a CardioNet. If we identify a rhythm disturbance we will consider a beta blocker. Further therapy based on above results.

## 2014-07-10 NOTE — Patient Instructions (Signed)
Your physician recommends that you schedule a follow-up appointment in: 8-12 [place] physician has requested that you have an echocardiogram. Echocardiography is a painless test that uses sound waves to create images of your heart. It provides your doctor with information about the size and shape of your heart and how well your heart's chambers and valves are working. This procedure takes approximately one hour. There are no restrictions for this procedure.   Your physician has recommended that you wear an event monitor. Event monitors are medical devices that record the heart's electrical activity. Doctors most often [place] these monitors to diagnose arrhythmias. Arrhythmias are problems with the speed or rhythm of the heartbeat. The monitor is a small, portable device. You can wear one while you do your normal daily activities. This is usually used to diagnose what is causing palpitations/syncope (passing out).   Your physician has requested that you have an exercise tolerance test. For further information please visit [employer]ugeFiesta[employer][email or web address]. Please also follow instruction sheet, as given.   PLEASE SCHEDULE AT [place]

## 2014-07-17 ENCOUNTER — Ambulatory Visit (HOSPITAL_COMMUNITY): Payer: 59 | Attending: Internal Medicine | Admitting: Radiology

## 2014-07-17 ENCOUNTER — Encounter (INDEPENDENT_AMBULATORY_CARE_PROVIDER_SITE_OTHER): Payer: 59

## 2014-07-17 ENCOUNTER — Encounter: Payer: Self-pay | Admitting: *Deleted

## 2014-07-17 DIAGNOSIS — R002 Palpitations: Secondary | ICD-10-CM | POA: Diagnosis not present

## 2014-07-17 DIAGNOSIS — R079 Chest pain, unspecified: Secondary | ICD-10-CM

## 2014-07-17 MED ORDER — PERFLUTREN LIPID MICROSPHERE
3.0000 mL | Freq: Once | INTRAVENOUS | Status: AC
  Administered 2014-07-17: 3 mL via INTRAVENOUS

## 2014-07-17 NOTE — Progress Notes (Signed)
Echocardiogram performed with [facility].  

## 2014-07-17 NOTE — Progress Notes (Signed)
Patient ID: Madeline Perkins, female   DOB: May 23, 1985, 29 y.o.   MRN: [id number] [employer] 30 day cardiac event monitor applied to patient.

## 2014-07-27 ENCOUNTER — Telehealth (HOSPITAL_COMMUNITY): Payer: Self-pay

## 2014-07-27 NOTE — Telephone Encounter (Signed)
Encounter complete. 

## 2014-08-01 ENCOUNTER — Telehealth (HOSPITAL_COMMUNITY): Payer: Self-pay

## 2014-08-01 NOTE — Telephone Encounter (Signed)
Encounter complete. 

## 2014-08-02 ENCOUNTER — Ambulatory Visit (HOSPITAL_COMMUNITY)
Admission: RE | Admit: 2014-08-02 | Discharge: 2014-08-02 | Disposition: A | Discharge status: Home / Self Care | Payer: 59 | Source: Ambulatory Visit | Attending: Cardiology | Admitting: Cardiology

## 2014-08-02 DIAGNOSIS — R079 Chest pain, unspecified: Secondary | ICD-10-CM | POA: Diagnosis not present

## 2014-08-02 DIAGNOSIS — R002 Palpitations: Secondary | ICD-10-CM | POA: Diagnosis not present

## 2014-08-02 IMAGING — CT CT ABD-PELV W/ CM
2 of 4 series · 14 of 32 positions shown, 19 images · IV contrast (water/omni  & 100ml omni 300)
Comparison: None.

CLINICAL DATA: Right lower abdominal pain

CT ABDOMEN AND PELVIS WITH CONTRAST
TECHNIQUE: Multidetector CT imaging of the abdomen and pelvis was
performed following the standard protocol during bolus
administration of intravenous contrast.
Contrast: 100mL OMNIPAQUE IOHEXOL 300 MG/ML  SOLN

[Series 2: routine abdomen · axial · 0.75mm/px · z∈[-478,-123]mm · 8 of 93 slices shown, 13 images]
[im 11/93  soft-tissue]
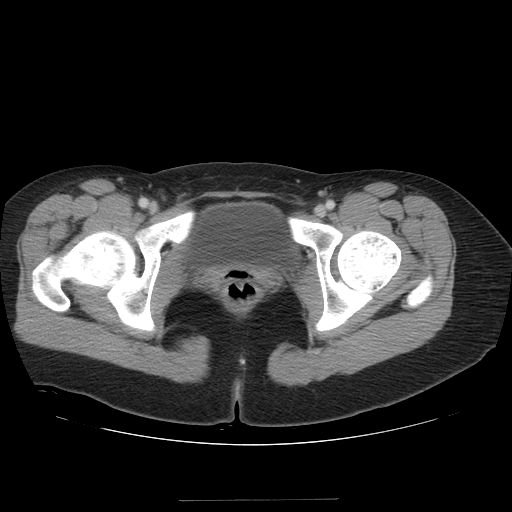
[im 11/93  bone]
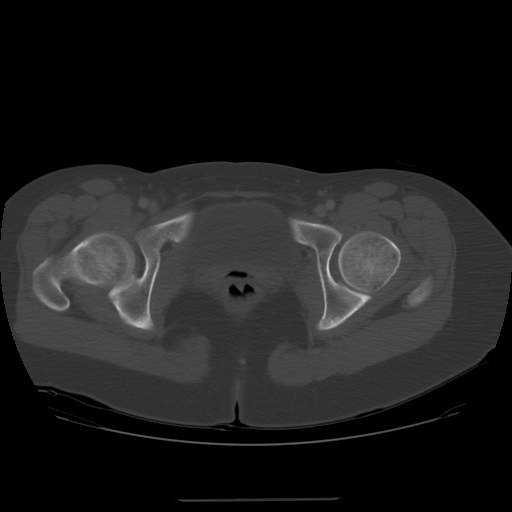
[im 21/93  soft-tissue]
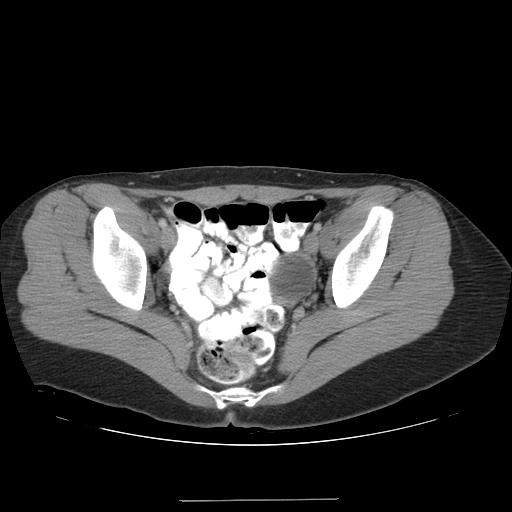
[im 31/93  soft-tissue]
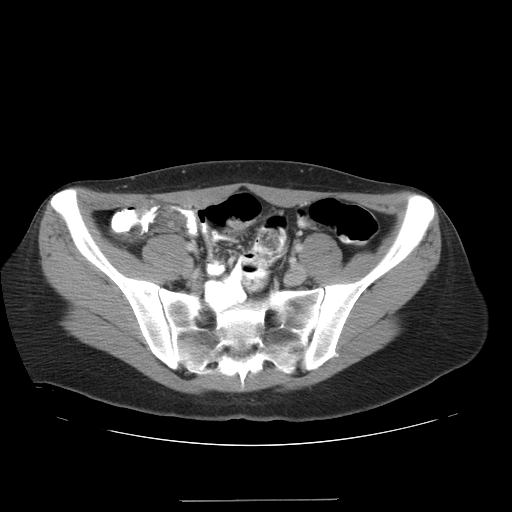
[im 41/93  soft-tissue]
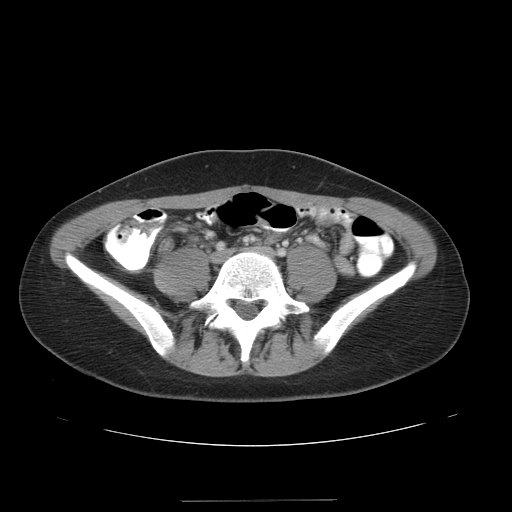
[im 52/93  soft-tissue]
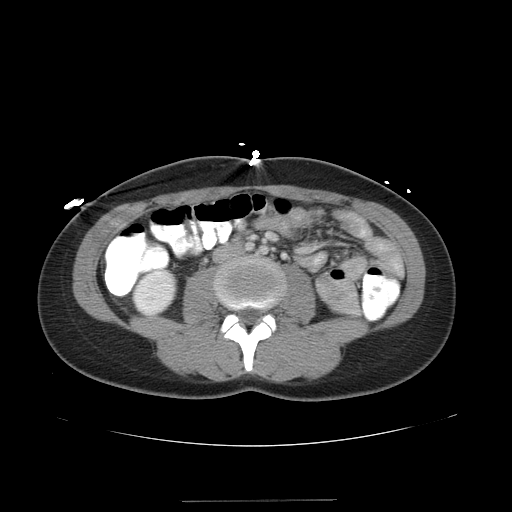
[im 52/93  lung]
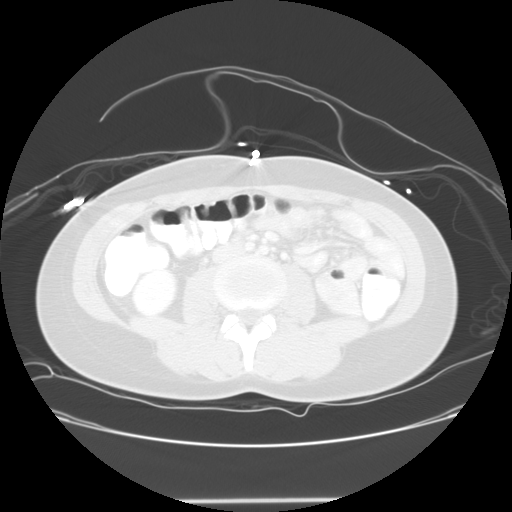
[im 62/93  soft-tissue]
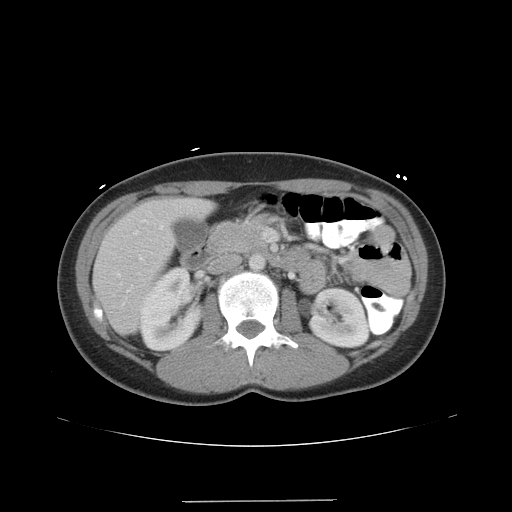
[im 62/93  lung]
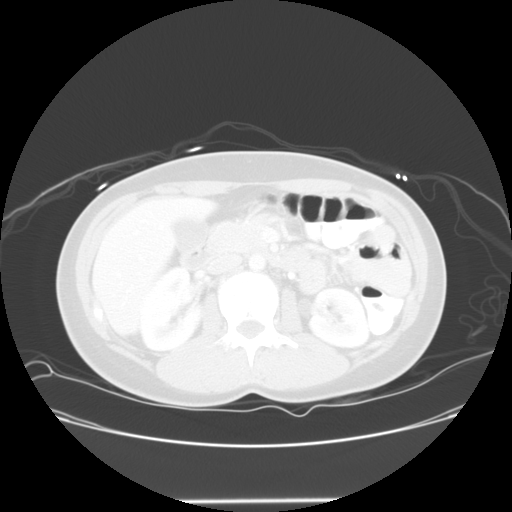
[im 72/93  soft-tissue]
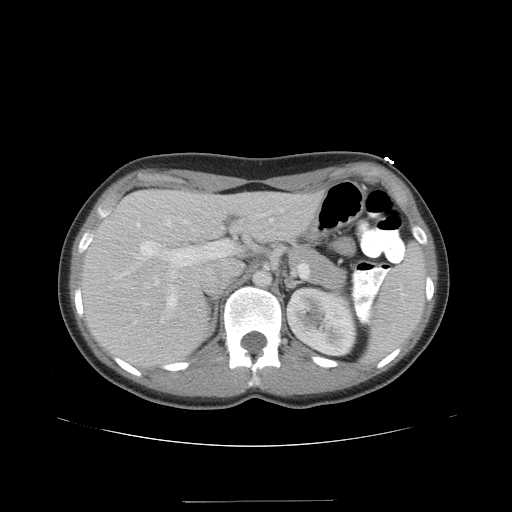
[im 72/93  lung]
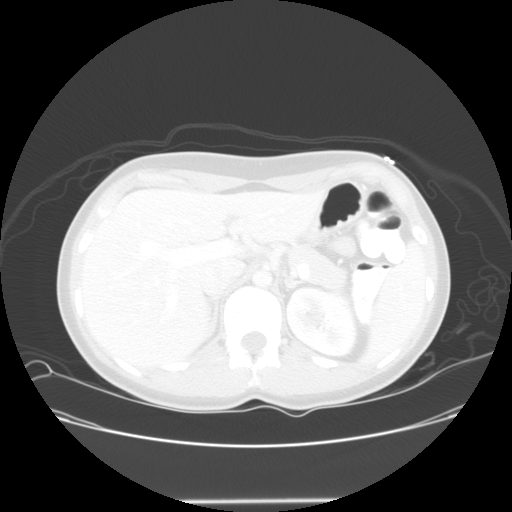
[im 82/93  soft-tissue]
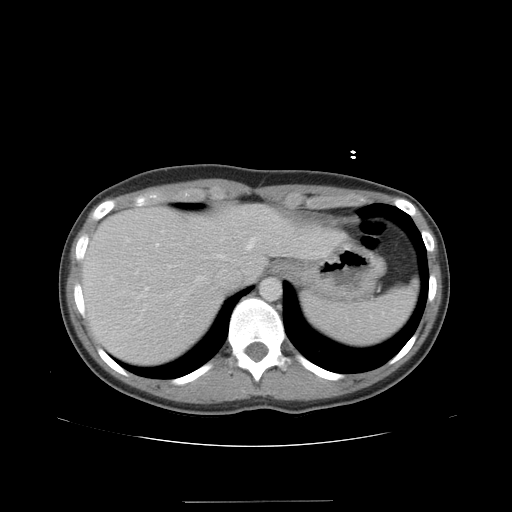
[im 82/93  lung]
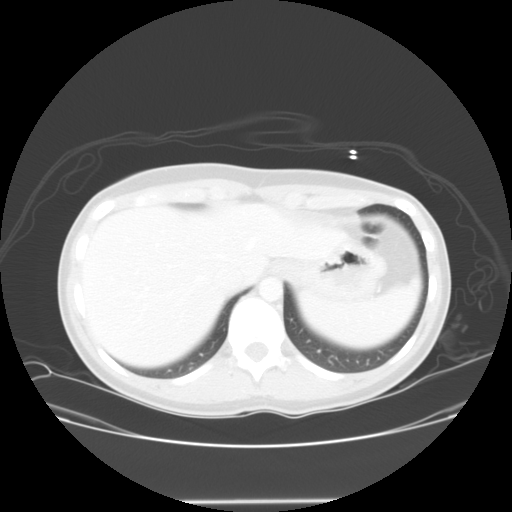

[Series 401: sagittals · sagittal · 0.93mm/px · 6 of 103 slices shown]
[im 10/103  soft-tissue]
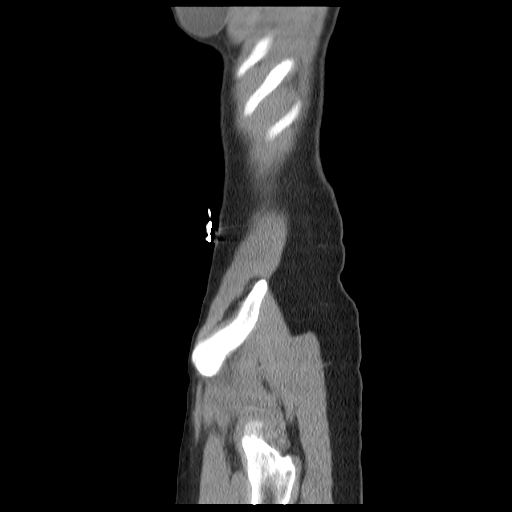
[im 19/103  soft-tissue]
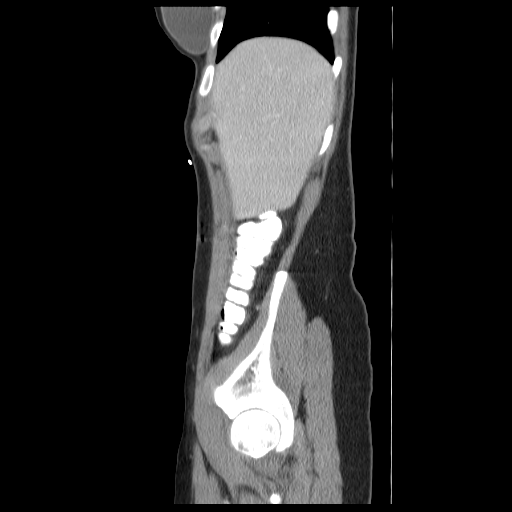
[im 38/103  soft-tissue]
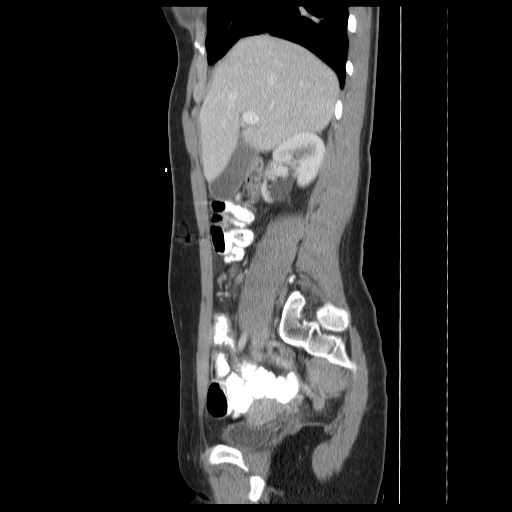
[im 47/103  soft-tissue]
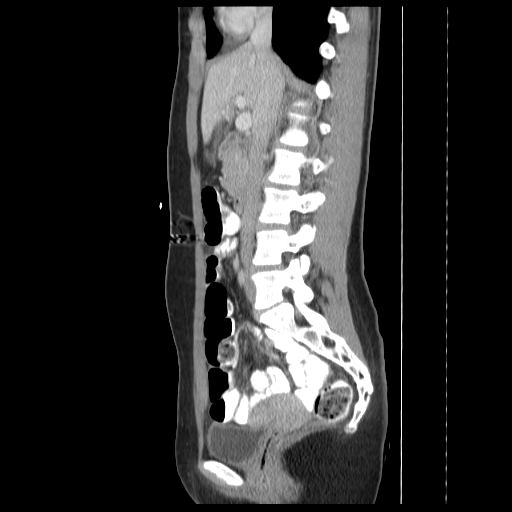
[im 56/103  soft-tissue]
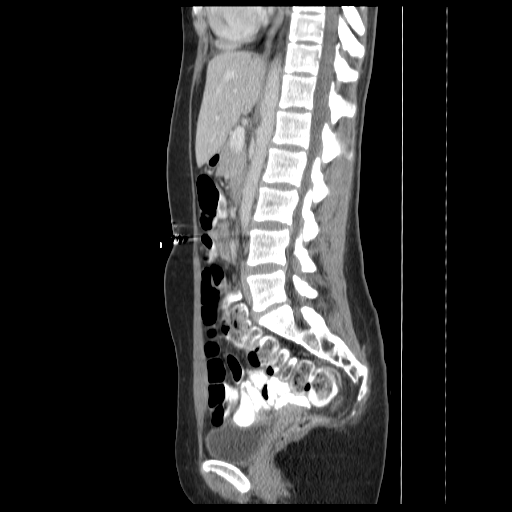
[im 65/103  soft-tissue]
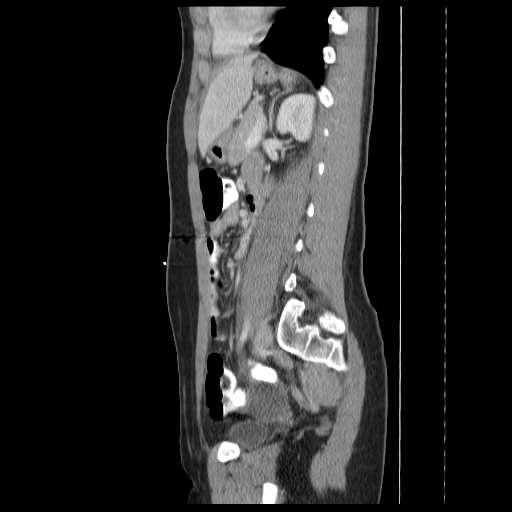

[14 of 32 positions shown; findings below may reference images not displayed]

FINDINGS: Bilateral breast implants are partially seen.  Visualized
lung bases clear.  Unremarkable liver, gallbladder, spleen, adrenal
glands, kidneys, pancreas, abdominal aorta, portal vein.  Stomach,
small bowel, and colon are nondilated.

The appendix is thick-walled, dilated to a diameter of 11 mm, with
some adjacent inflammatory/edematous changes.  No evidence of
perforation or abscess.

3.8 cm left adnexal cyst.  Uterus and right adnexal region
unremarkable.  No free fluid.  Urinary bladder incompletely
distended.  Regional bones unremarkable.
IMPRESSION: 1.  Acute appendicitis without perforation or abscess.
I telephoned the critical test results to Ashoq Ullah Parsa at the
time of interpretation.
2.  Left ovarian cyst, probably physiologic.

## 2014-08-02 NOTE — Procedures (Signed)
Exercise Treadmill Test    Test  Exercise Tolerance Test Ordering MD: Kirk Ruths, MD  Interpreting MD:   Unique Test No: 1 Treadmill:  1  Indication for ETT: chest pain - rule out ischemia  Contraindication to ETT: No   Stress Modality: exercise - treadmill  Cardiac Imaging Performed: non   Protocol: standard Bruce - maximal  Max BP:  143/84  Max MPHR (bpm):  192 85% MPR (bpm):  163  MPHR obtained (bpm):  193 % MPHR obtained:  100  Reached 85% MPHR (min:sec):  6:50 Total Exercise Time (min-sec):  10:47  Workload in METS:  13.00 Borg Scale:   Reason ETT Terminated:  patient's desire to stop due to chest tightness.    ST Segment Analysis At Rest: normal ST segments - no evidence of significant ST depression With Exercise: upsloping ST depression  Other Information Arrhythmia:  No Angina during ETT:  present (1) Quality of ETT:  diagnostic  ETT Interpretation:  normal - no evidence of ischemia by ST analysis  Comments: <10m upsloping ST depression inferiorly Excellent exercise tolerance Normal BP response to exercise Chest tightness was noted.  KPixie Casino MD, FGillette Childrens Spec HospAttending Cardiologist CKimberly

## 2014-08-09 ENCOUNTER — Encounter (HOSPITAL_COMMUNITY): Payer: 59

## 2014-08-18 ENCOUNTER — Encounter: Payer: Self-pay | Admitting: *Deleted

## 2014-09-04 ENCOUNTER — Telehealth: Payer: Self-pay | Admitting: *Deleted

## 2014-09-04 NOTE — Telephone Encounter (Signed)
Monitor reviewed by dr Stanford Breed shows sinus with occ pvc. Left message for pt to call

## 2014-09-21 ENCOUNTER — Encounter: Payer: Self-pay | Admitting: *Deleted

## 2014-09-21 NOTE — Telephone Encounter (Signed)
Letter of results sent to pt  

## 2015-01-31 NOTE — Progress Notes (Signed)
      HPI: FU palpitations and tachycardia. CTA 2/16 showed no pulmonary embolus. Labs 2/16 showed normal TSH and Hgb. CardioNet March 2016 showed sinus with occasional PVC. Exercise treadmill April 2016 showed no ST changes. Echocardiogram March 2016 was technically difficult. However LV function was normal. Since she was last seen She is having stressful times at work and at home as she is going through a separation. Approximately one week ago she had chest pain, palpitations and dyspnea. The pain was not pleuritic or positional. Lasted all day. Otherwise no dyspnea on exertion, orthopnea, PND, pedal edema, syncope or exertional chest pain. Occasional palpitations.  Current Outpatient Prescriptions  Medication Sig Dispense Refill  . calcium-vitamin D 250-100 MG-UNIT per tablet Take 1 tablet by mouth daily.    . Flaxseed, Linseed, (FLAX SEED OIL) 1000 MG CAPS Take by mouth.    . Iron 66 MG TABS Take 1 tablet by mouth 4 (four) times a week.     . Multiple Vitamins-Minerals (MULTIVITAMINS THER. W/MINERALS) TABS Take 1 tablet by mouth daily.     No current facility-administered medications for this visit.     Past Medical History  Diagnosis Date  . GERD (gastroesophageal reflux disease)   . Cancer ([place])     melanoma    Past Surgical History  Procedure Laterality Date  . Breast enhancement surgery    . Wisdom tooth extraction  Nov 2009  . Laparoscopic appendectomy  05/03/2012    Procedure: APPENDECTOMY LAPAROSCOPIC;  Surgeon: Gwenyth Ober, MD;  Location: [facility];  Service: General;  Laterality: N/A;  . Melanoma excision with sentinel lymph node biopsy N/A 08/10/2012    Procedure: RE-EXCISION BACK MELANOMA WITH SENTINEL LEFT INGUINAL LYMPH NODE BIOPSY;  Surgeon: Gwenyth Ober, MD;  Location: [facility];  Service: General;  Laterality: N/A;    Social History   Social History  . Marital Status: Married    Spouse Name: N/A  . Number of Children: N/A  . Years of Education: N/A    Occupational History  .      [employer] technician   Social History Main Topics  . Smoking status: Never Smoker   . Smokeless tobacco: Not on file  . Alcohol Use: 0.0 oz/week    0 Standard drinks or equivalent per week     Comment: Occasional  . Drug Use: No  . Sexual Activity: Yes    Birth Control/ Protection: Pill   Other Topics Concern  . Not on file   Social History Narrative    ROS: no fevers or chills, productive cough, hemoptysis, dysphasia, odynophagia, melena, hematochezia, dysuria, hematuria, rash, seizure activity, orthopnea, PND, pedal edema, claudication. Remaining systems are negative.  Physical Exam: Well-developed well-nourished in no acute distress.  Skin is warm and dry.  HEENT is normal.  Neck is supple.  Chest is clear to auscultation with normal expansion.  Cardiovascular exam is regular rate and rhythm.  Abdominal exam nontender or distended. No masses palpated. Extremities show no edema. neuro grossly intact  ECG Sinus rhythm at a rate of 76. No ST changes.

## 2015-02-02 ENCOUNTER — Ambulatory Visit (INDEPENDENT_AMBULATORY_CARE_PROVIDER_SITE_OTHER): Payer: 59 | Admitting: Cardiology

## 2015-02-02 ENCOUNTER — Encounter: Payer: Self-pay | Admitting: Cardiology

## 2015-02-02 VITALS — BP 124/84 | HR 76 | Ht 70.0 in | Wt 167.0 lb

## 2015-02-02 DIAGNOSIS — R002 Palpitations: Secondary | ICD-10-CM | POA: Diagnosis not present

## 2015-02-02 MED ORDER — METOPROLOL SUCCINATE ER 25 MG PO TB24: 25.0000 mg | ORAL_TABLET | Freq: Every day | ORAL | Status: DC | PRN

## 2015-02-02 NOTE — Assessment & Plan Note (Signed)
Patient continues to have occasional palpitations. Previous cardiac workup negative. I will try low-dose Toprol at 25 mg daily as needed.

## 2015-02-02 NOTE — Patient Instructions (Signed)
Medication Instructions:   START METOPROLOL SUCC ER 25 MG ONCE DAILY AS NEEDED FOR PALPITATIONS  Follow-Up:  Your physician wants you to follow-up in: [place] will receive a reminder letter in the mail two months in advance. If you don't receive a letter, please call our office to schedule the follow-up appointment.

## 2015-02-05 ENCOUNTER — Encounter: Payer: Self-pay | Admitting: Osteopathic Medicine

## 2015-02-05 ENCOUNTER — Ambulatory Visit (INDEPENDENT_AMBULATORY_CARE_PROVIDER_SITE_OTHER): Payer: 59 | Admitting: Osteopathic Medicine

## 2015-02-05 VITALS — BP 107/73 | HR 83 | Temp 98.2°F | Resp 16 | Ht 70.0 in | Wt 167.0 lb

## 2015-02-05 DIAGNOSIS — R002 Palpitations: Secondary | ICD-10-CM | POA: Diagnosis not present

## 2015-02-05 DIAGNOSIS — F411 Generalized anxiety disorder: Secondary | ICD-10-CM | POA: Diagnosis not present

## 2015-02-05 MED ORDER — HYDROXYZINE PAMOATE 50 MG PO CAPS: 50.0000 mg | ORAL_CAPSULE | Freq: Three times a day (TID) | ORAL | Status: DC | PRN

## 2015-02-05 NOTE — Progress Notes (Signed)
HPI: Madeline Perkins is a 29 y.o. female who presents to [facility]  today for chief complaint of:  Chief Complaint  Patient presents with  . Establish Care   Panic attacks . Severity: mild to moderate . Duration: 06/2014 left spouse, was significantly stressed prior to this as well.  . Timing: attacks last about 30 minutes, happening one or two times per week, getting worse with assoc symptoms as below . Context: going through separation at the moment  . Modifying factors: never been on medications or hospitalized for psychiatric problems . Assoc signs/symptoms: chest discomfort and difficulty breathing during the attacks.      Past medical, social and family history reviewed: Past Medical History  Diagnosis Date  . GERD (gastroesophageal reflux disease)   . Cancer ([place])     melanoma   Past Surgical History  Procedure Laterality Date  . Breast enhancement surgery    . Wisdom tooth extraction  Nov 2009  . Laparoscopic appendectomy  05/03/2012    Procedure: APPENDECTOMY LAPAROSCOPIC;  Surgeon: Gwenyth Ober, MD;  Location: [facility];  Service: General;  Laterality: N/A;  . Melanoma excision with sentinel lymph node biopsy N/A 08/10/2012    Procedure: RE-EXCISION BACK MELANOMA WITH SENTINEL LEFT INGUINAL LYMPH NODE BIOPSY;  Surgeon: Gwenyth Ober, MD;  Location: [facility];  Service: General;  Laterality: N/A;   Social History  Substance Use Topics  . Smoking status: Never Smoker   . Smokeless tobacco: Not on file  . Alcohol Use: 0.0 oz/week    0 Standard drinks or equivalent per week     Comment: Occasional   Family History  Problem Relation Age of Onset  . Other Mother   . Cancer Mother     lymphoma  . Cancer Paternal Grandfather     esophageal  . Hypertension Mother   . Hypertension Father     Current Outpatient Prescriptions  Medication Sig Dispense Refill  . calcium-vitamin D 250-100 MG-UNIT per tablet Take 1 tablet by mouth daily.    .  Flaxseed, Linseed, (FLAX SEED OIL) 1000 MG CAPS Take by mouth.    . Iron 66 MG TABS Take 1 tablet by mouth 4 (four) times a week.     . metoprolol succinate (TOPROL XL) 25 MG 24 hr tablet Take 1 tablet (25 mg total) by mouth daily as needed. 30 tablet 11  . Multiple Vitamins-Minerals (MULTIVITAMINS THER. W/MINERALS) TABS Take 1 tablet by mouth daily.     No current facility-administered medications for this visit.   Allergies  Allergen Reactions  . Penicillins Hives    Childhood allergy      Review of Systems: CONSTITUTIONAL: Neg fever/chills, no unintentional weight changes HEAD/EYES/EARS/NOSE/THROAT: No headache/vision change or hearing change, no sore throat CARDIAC: No chest pain/pressure/palpitations today, no orthopnea RESPIRATORY: No cough/shortness of breath/wheeze GASTROINTESTINAL: No nausea/vomiting/abdominal pain/blood in stool/diarrhea/constipation MUSCULOSKELETAL: No myalgia/arthralgia GENITOURINARY: No incontinence, No abnormal genital bleeding/discharge SKIN: No rash/wounds/concerning lesions HEM/ONC: No easy bruising/bleeding, no abnormal lymph node ENDOCRINE: No polyuria/polydipsia/polyphagia, no heat/cold intolerance  NEUROLOGIC: No weakness/dizzines/slurred speech PSYCHIATRIC: No concerns with depression or sleep problems (+) anxiety/panic as per HPI   Exam:  There were no vitals taken for this visit. Constitutional: VSS, see above. General Appearance: alert, well-developed, well-nourished, NAD Eyes: Normal lids and conjunctive, non-icteric sclera,  Neck: No masses, trachea midline. No thyroid enlargement/tenderness/mass appreciated Respiratory: Normal respiratory effort. no wheeze/rhonchi/rales Cardiovascular: S1/S2 normal, no murmur/rub/gallop auscultated. RRR. No carotid bruit or JVD. No  abdominal aortic bruit. Pedal pulse II/IV bilaterally DP and PT. No lower extremity edema. Gastrointestinal: Nontender, no masses. No hepatomegaly, no splenomegaly. No hernia  appreciated. Bowel sounds normal. Rectal exam deferred.  Musculoskeletal: Gait normal. No clubbing/cyanosis of digits.  Neurological: No cranial nerve deficit on limited exam. Motor and sensation intact and symmetric Psychiatric: Normal judgment/insight. Normal mood and affect. Oriented x3.    No results found for this or any previous visit (from the past 72 hour(s)).  RECORDS REVIEWED: Cardio notes, see updated problem list, w/u negative for cardiac cause of palpitation   LABS REVIEWED: 05/26/14 normal thyroid, BMP, Mg, U-preg, Trop-I. Mild elevated WBC otherwise normal CBC.   ASSESSMENT/PLAN:  Anxiety state - Plan: hydrOXYzine (VISTARIL) 50 MG capsule - will hold off on benzos for now, intermittent anxiety problems, advise seek counseling (she already has name of counselor recommended by a friend), advised if vistaril sn't helping RTC for discussion of medication changes and possible SSRI/daily Rx for anxiety. pt not on contraception - advised that pregnancy may complicate prescription medicine management (Toprol XL also caution advised 2nd/3rd TM but ok to take now). Pregnancy not a concern for her at this time.   Palpitations - cardiac w/u negative, ER precautions reviewed.

## 2015-02-05 NOTE — Patient Instructions (Signed)
Advise seeking appointment with counselor to help develop coping strategies for stressful situations. If medications are not helping, or if you are requiring them on a daily basis, please return for an appointment with Dr Sheppard Coil to discuss changing medicines and possible starting a daily medication for anxiety problems. Otherwise, we will plan on annual physical next year in the springtime.

## 2015-02-12 ENCOUNTER — Telehealth: Payer: Self-pay | Admitting: Cardiology

## 2015-02-12 NOTE — Telephone Encounter (Signed)
Can take another toprol 25 mg later this PM if symptoms persist Kirk Ruths

## 2015-02-12 NOTE — Telephone Encounter (Signed)
Returned call to patient. She states she is having bad palpitations since about 2pm and took her metoprolol succinate 25mg  prn. This has not helped her symptoms. She reports some chest tightness and feels like she is having to "work to breathe".   BP 122/80 HR up to 122 when standing.  She states is was a little orthostatic (based on her HR?)  Informed her I will send to MD to advise

## 2015-02-12 NOTE — Telephone Encounter (Signed)
Pt called in stating that her heart has been racing for the past 2 1/2 hours and she took a Metoprolol , which she said did not work so she would like to know if another one. Please f/u with her  Thanks

## 2015-02-12 NOTE — Telephone Encounter (Signed)
Spoke with pt, Aware of dr crenshaw's recommendations.  °

## 2015-02-13 ENCOUNTER — Telehealth: Payer: Self-pay

## 2015-02-13 MED ORDER — ALPRAZOLAM 0.25 MG PO TABS: 0.2500 mg | ORAL_TABLET | Freq: Two times a day (BID) | ORAL | Status: DC | PRN

## 2015-02-13 NOTE — Telephone Encounter (Signed)
Left message advising of recommendations.  

## 2015-02-13 NOTE — Telephone Encounter (Signed)
Patient left a message stating the hydroxyzine is not helping.

## 2015-02-13 NOTE — Telephone Encounter (Signed)
Please call patient:  Will trial Xanax, will fax this prescription.  This is a controlled substance, patient will need to come in for an appointment to follow up with me in 2 weeks to continue this medicine or discuss alternatives. Thanks.

## 2015-03-02 ENCOUNTER — Encounter: Payer: Self-pay | Admitting: Osteopathic Medicine

## 2015-03-02 ENCOUNTER — Ambulatory Visit (INDEPENDENT_AMBULATORY_CARE_PROVIDER_SITE_OTHER): Payer: 59 | Admitting: Osteopathic Medicine

## 2015-03-02 VITALS — BP 121/82 | HR 70 | Ht 70.0 in | Wt 171.0 lb

## 2015-03-02 DIAGNOSIS — F411 Generalized anxiety disorder: Secondary | ICD-10-CM | POA: Diagnosis not present

## 2015-03-02 DIAGNOSIS — R002 Palpitations: Secondary | ICD-10-CM | POA: Diagnosis not present

## 2015-03-02 NOTE — Progress Notes (Signed)
HPI: Madeline Perkins is a 29 y.o. female who presents to [facility]  today for chief complaint of:  Chief Complaint  Patient presents with  . Follow-up    anxiety    Patient was recently seen for anxiety/panic attacks, going through a separation. She does have history of palpitations, previously worked up as negative by cardiology though she does occasionally take Toprol. Hydroxyzine was tried initially without benefit, patient was started on Xanax. 20 pills were prescribed, patient states she has only used 4 of them since her prescription which was written about 3 weeks ago. She states she is only using 1 pill about 1 or 2 times per week, thinks that this will work well for her. GAD 7 results as below. Patient still has not seen counselor, she states that friend at work is going to give her the name of a Social worker. This was also discussed at patient's last visit.  Past medical, social and family history reviewed: Past Medical History  Diagnosis Date  . GERD (gastroesophageal reflux disease)   . Cancer ([place])     melanoma  . Intermittent palpitations     w/u by cardio neg (Dr Stanford Breed), Toprol prn   Past Surgical History  Procedure Laterality Date  . Breast enhancement surgery    . Wisdom tooth extraction  Nov 2009  . Laparoscopic appendectomy  05/03/2012    Procedure: APPENDECTOMY LAPAROSCOPIC;  Surgeon: Gwenyth Ober, MD;  Location: [facility];  Service: General;  Laterality: N/A;  . Melanoma excision with sentinel lymph node biopsy N/A 08/10/2012    Procedure: RE-EXCISION BACK MELANOMA WITH SENTINEL LEFT INGUINAL LYMPH NODE BIOPSY;  Surgeon: Gwenyth Ober, MD;  Location: [facility];  Service: General;  Laterality: N/A;   Social History  Substance Use Topics  . Smoking status: Never Smoker   . Smokeless tobacco: Not on file  . Alcohol Use: 0.0 oz/week    0 Standard drinks or equivalent per week     Comment: Occasional   Family History  Problem Relation  Age of Onset  . Other Mother   . Cancer Mother     lymphoma  . Cancer Paternal Grandfather     esophageal  . Hypertension Mother   . Hypertension Father     Current Outpatient Prescriptions  Medication Sig Dispense Refill  . ALPRAZolam (XANAX) 0.25 MG tablet Take 1 tablet (0.25 mg total) by mouth 2 (two) times daily as needed (panic attacks). 2 TIMES DAILY IS MAXIMUM. Try to limit use to one or two days per week. 20 tablet 0  . calcium-vitamin D 250-100 MG-UNIT per tablet Take 1 tablet by mouth daily.    . Iron 66 MG TABS Take 1 tablet by mouth 4 (four) times a week.     . metoprolol succinate (TOPROL XL) 25 MG 24 hr tablet Take 1 tablet (25 mg total) by mouth daily as needed. 30 tablet 11  . Multiple Vitamins-Minerals (MULTIVITAMINS THER. W/MINERALS) TABS Take 1 tablet by mouth daily.     No current facility-administered medications for this visit.   Allergies  Allergen Reactions  . Penicillins Hives    Childhood allergy      Review of Systems: CARDIAC: No chest pain, no pressure/palpitations, no orthopnea RESPIRATORY: No  cough, No  shortness of breath/wheeze PSYCHIATRIC: No concerns with depression, no concerns with anxiety today, meds are helping see HPI, no sleep problems    Exam:  BP 121/82 mmHg  Pulse 70  Ht  5\' 10"  (1.778 m)  Wt 171 lb (77.565 kg)  BMI 24.54 kg/m2  LMP 01/11/2015 (Exact Date) Constitutional: VSS, see above. General Appearance: alert, well-developed, well-nourished, NAD Psychiatric: Normal judgment/insight. Normal mood and affect. Oriented x3.    No results found for this or any previous visit (from the past 72 hour(s)).    ASSESSMENT/PLAN:  Anxiety state  Palpitations   Continue Xanax for now. Patient is only using intermittently, situational anxiety seems to be the issue rather than significant generalized anxiety disorder. Patient is advised that if she is requiring use of this medication more than once or twice per week, where she is  requiring this medication for longer than 3-6 months, we'll have to think about starting daily SSRI or other preventive measure for panic/anxiety. Patient is also advised to seek counselor, consider this prescription from me. Advised that those who do counseling plus medications tend to have better outcomes. Patient agrees to seek counseling. Records reviewed, patient also called cardiologist who recommended increased dose Toprol as needed for uncontrolled palpitations. Patient advised the combination of beta blocker past benzodiazepine can cause problems such as fatigue, dizziness, lightheadedness. Advised avoid taking these 2 medications together.   Return in about 3 months (around 06/02/2015), or sooner if needed, for ANNUAL PHYSICAL .  Total time spent 15 minutes, greater than 50% of the visit was counseling and coordinating care for diagnosis of anxiety.

## 2015-05-16 DIAGNOSIS — Z85820 Personal history of malignant melanoma of skin: Secondary | ICD-10-CM | POA: Diagnosis not present

## 2015-05-16 DIAGNOSIS — D225 Melanocytic nevi of trunk: Secondary | ICD-10-CM | POA: Diagnosis not present

## 2015-05-16 DIAGNOSIS — L700 Acne vulgaris: Secondary | ICD-10-CM | POA: Diagnosis not present

## 2015-05-16 DIAGNOSIS — L812 Freckles: Secondary | ICD-10-CM | POA: Diagnosis not present

## 2015-05-24 ENCOUNTER — Other Ambulatory Visit: Payer: Self-pay

## 2015-05-24 MED ORDER — ALPRAZOLAM 0.25 MG PO TABS: 0.2500 mg | ORAL_TABLET | Freq: Two times a day (BID) | ORAL | Status: DC | PRN

## 2015-06-20 ENCOUNTER — Encounter: Payer: 59 | Admitting: Osteopathic Medicine

## 2015-06-26 ENCOUNTER — Encounter: Payer: Self-pay | Admitting: Osteopathic Medicine

## 2015-06-26 ENCOUNTER — Ambulatory Visit (INDEPENDENT_AMBULATORY_CARE_PROVIDER_SITE_OTHER): Payer: 59 | Admitting: Osteopathic Medicine

## 2015-06-26 ENCOUNTER — Other Ambulatory Visit (HOSPITAL_COMMUNITY)
Admission: RE | Admit: 2015-06-26 | Discharge: 2015-06-26 | Disposition: A | Discharge status: Home / Self Care | Payer: 59 | Source: Ambulatory Visit | Attending: Osteopathic Medicine | Admitting: Osteopathic Medicine

## 2015-06-26 VITALS — BP 131/87 | HR 74 | Ht 70.0 in | Wt 177.0 lb

## 2015-06-26 DIAGNOSIS — Z862 Personal history of diseases of the blood and blood-forming organs and certain disorders involving the immune mechanism: Secondary | ICD-10-CM | POA: Diagnosis not present

## 2015-06-26 DIAGNOSIS — N760 Acute vaginitis: Secondary | ICD-10-CM | POA: Diagnosis not present

## 2015-06-26 DIAGNOSIS — Z0000 Encounter for general adult medical examination without abnormal findings: Secondary | ICD-10-CM

## 2015-06-26 DIAGNOSIS — R058 Other specified cough: Secondary | ICD-10-CM

## 2015-06-26 DIAGNOSIS — R05 Cough: Secondary | ICD-10-CM

## 2015-06-26 DIAGNOSIS — Z124 Encounter for screening for malignant neoplasm of cervix: Secondary | ICD-10-CM

## 2015-06-26 DIAGNOSIS — Z01419 Encounter for gynecological examination (general) (routine) without abnormal findings: Secondary | ICD-10-CM | POA: Insufficient documentation

## 2015-06-26 DIAGNOSIS — Z113 Encounter for screening for infections with a predominantly sexual mode of transmission: Secondary | ICD-10-CM | POA: Insufficient documentation

## 2015-06-26 MED ORDER — GUAIFENESIN-CODEINE 100-10 MG/5ML PO SOLN: 5.0000 mL | Freq: Four times a day (QID) | ORAL | Status: DC | PRN

## 2015-06-26 MED ORDER — METHYLPREDNISOLONE 4 MG PO TBPK: ORAL_TABLET | ORAL | Status: DC

## 2015-06-26 NOTE — Patient Instructions (Signed)
If Pap is normal, can repeat this in 3 years. Please let us know if you haven't heard from [place] about your results within one week!   We are getting labs for routine HIV screening and to monitor blood counts given your history of anemia, we usually have these results back in a day or two.   Can use cough syrup as needed and try steroid taper to help with post-viral cough syndrome.   Let us know when you are in need of Xanax refill and we can take care of this for you.   See if you can pin down the date you had your most recent tetanus vaccine - unfortunately nothing is in the computer but [employer] may be able to tell you, or you can sign records release for [place] to get that vaccination report.  Any other questions or concerns, let us know! Take care! -Dr. Loni Muse.

## 2015-06-26 NOTE — Progress Notes (Signed)
HPI: Madeline Perkins is a 30 y.o. female who presents to [facility] today for chief complaint of:  Chief Complaint  Patient presents with  . Annual Exam     Preventive care reviewed as below.  Past medical, social and family history reviewed: Past Medical History  Diagnosis Date  . GERD (gastroesophageal reflux disease)   . Cancer ([place])     melanoma  . Intermittent palpitations     w/u by cardio neg (Dr Stanford Breed), Toprol prn   Past Surgical History  Procedure Laterality Date  . Breast enhancement surgery    . Wisdom tooth extraction  Nov 2009  . Laparoscopic appendectomy  05/03/2012    Procedure: APPENDECTOMY LAPAROSCOPIC;  Surgeon: Gwenyth Ober, MD;  Location: [facility];  Service: General;  Laterality: N/A;  . Melanoma excision with sentinel lymph node biopsy N/A 08/10/2012    Procedure: RE-EXCISION BACK MELANOMA WITH SENTINEL LEFT INGUINAL LYMPH NODE BIOPSY;  Surgeon: Gwenyth Ober, MD;  Location: [facility];  Service: General;  Laterality: N/A;   Social History  Substance Use Topics  . Smoking status: Never Smoker   . Smokeless tobacco: Not on file  . Alcohol Use: 0.0 oz/week    0 Standard drinks or equivalent per week     Comment: Occasional   Family History  Problem Relation Age of Onset  . Other Mother   . Cancer Mother     lymphoma  . Cancer Paternal Grandfather     esophageal  . Hypertension Mother   . Hypertension Father     Current Outpatient Prescriptions  Medication Sig Dispense Refill  . ALPRAZolam (XANAX) 0.25 MG tablet Take 1 tablet (0.25 mg total) by mouth 2 (two) times daily as needed (panic attacks). 2 TIMES DAILY IS MAXIMUM. Try to limit use to one or two days per week. 20 tablet 0  . calcium-vitamin D 250-100 MG-UNIT per tablet Take 1 tablet by mouth daily.    . Iron 66 MG TABS Take 1 tablet by mouth 4 (four) times a week.     . metoprolol succinate (TOPROL XL) 25 MG 24 hr tablet Take 1 tablet (25 mg total) by mouth  daily as needed. 30 tablet 11  . Multiple Vitamins-Minerals (MULTIVITAMINS THER. W/MINERALS) TABS Take 1 tablet by mouth daily.     No current facility-administered medications for this visit.   Allergies  Allergen Reactions  . Penicillins Hives    Childhood allergy      Review of Systems: CONSTITUTIONAL:  No  fever, no chills, No  unintentional weight changes HEAD/EYES/EARS/NOSE/THROAT: No  headache, no vision change, no hearing change, No  sore throat, No  sinus pressure CARDIAC: No  chest pain, No  pressure, No palpitations, No  orthopnea RESPIRATORY: (+) cough, No  shortness of breath/wheeze GASTROINTESTINAL: No  nausea, No  vomiting, No  abdominal pain, No  blood in stool, No  diarrhea, No  constipation  MUSCULOSKELETAL: No  myalgia/arthralgia GENITOURINARY: No  incontinence, No  abnormal genital bleeding/discharge SKIN: No  rash/wounds/concerning lesions HEM/ONC: No  easy bruising/bleeding, No  abnormal lymph node ENDOCRINE: No polyuria/polydipsia/polyphagia, No  heat/cold intolerance  NEUROLOGIC: No  weakness, No  dizziness, No  slurred speech PSYCHIATRIC: No  concerns with depression, No  concerns with anxiety, No sleep problems  Exam:  BP 131/87 mmHg  Pulse 74  Ht 5\' 10"  (1.778 m)  Wt 177 lb (80.287 kg)  BMI 25.40 kg/m2 Constitutional: VS see above. General Appearance: alert, well-developed,  well-nourished, NAD Eyes: Normal lids and conjunctive, non-icteric sclera, PERRLA Ears, Nose, Mouth, Throat: MMM, Normal external inspection ears/nares/mouth/lips/gums, TM normal bilaterally. Pharynx no erythema, no exudate.  Neck: No masses, trachea midline. No thyroid enlargement/tenderness/mass appreciated. No lymphadenopathy Respiratory: Normal respiratory effort. no wheeze, no rhonchi, no rales Cardiovascular: S1/S2 normal, no murmur, no rub/gallop auscultated. RRR.  No carotid bruit or JVD. No abdominal aortic bruit.  Pedal pulse II/IV bilaterally DP and PT.  No lower  extremity edema. Gastrointestinal: Nontender, no masses. No hepatomegaly, no splenomegaly. No hernia appreciated. Bowel sounds normal. Rectal exam deferred.  Musculoskeletal: Gait normal. No clubbing/cyanosis of digits.  Neurological: No cranial nerve deficit on limited exam. Motor and sensation intact and symmetric Skin: warm, dry, intact. No rash/ulcer. No concerning nevi or subq nodules on limited exam.   Psychiatric: Normal judgment/insight. Normal mood and affect. Oriented x3.    No results found for this or any previous visit (from the past 72 hour(s)).    ASSESSMENT/PLAN: low CV/DM2 risk based on personal and family history. Need to get records re: last Tetanus vaccination but patient states this was almost certainly in the past 3 - 4 years. Pap today, routine STI testing, CBC for hx anemia. Pt c/o mild postviral cough, treat as below. Ok to refill Xanax if needed, she uses these sparingly and presents leftover pills today form Rx written several months ago. RTC 1 year/prn.   Annual physical exam  Cervical cancer screening - Plan: Cytology - PAP  Routine screening for STI (sexually transmitted infection) - Plan: Cytology - PAP, HIV antibody  History of anemia - Plan: CBC with Differential/Platelet  Post-viral cough syndrome - Plan: guaiFENesin-codeine 100-10 MG/5ML syrup, methylPREDNISolone (MEDROL DOSEPAK) 4 MG TBPK tablet  FEMALE PREVENTIVE CARE  ANNUAL SCREENING/COUNSELING Tobacco - Never  Alcohol - none Diet/Exercise - [place] DISCUSSED TO DECREASE CV RISK Sexual Health - Yes with female. STI - The patient denies history of sexually transmitted disease. INTERESTED IN STI TESTING - yes Depression - PQH2 Negative Domestic violence concerns - no HTN SCREENING - SEE VITALS Vaccination status - SEE BELOW  INFECTIOUS DISEASE SCREENING HIV - all adults 15-65 - needs GC/CT - sexually active - needs HepC - born 82-1965 - does not need TB - if risk/required by  employer - does not need  DISEASE SCREENING Lipid - (Low risk screen M35/F45; High risk screen M25/F35 if HTN, Tob, FH CHD M<55/F<65) - does not need - not high risk DM2 (45+ or Risk = FH 1st deg DM, Hx GDM, overweight/sedentary, high-risk ethnicity, HTN) - does not need - not high risk  CANCER SCREENING Cervical - PAP - needs Breast - does not need - no FH Colon - does not need  ADULT VACCINATION Influenza - annual - already has Td booster every 10 years - already has HPV - age <52yo - was not indicated   No Follow-up on file.

## 2015-06-27 LAB — CBC WITH DIFFERENTIAL/PLATELET
BASOS ABS: 0.1 10*3/uL (ref 0.0–0.1)
BASOS PCT: 1 % (ref 0–1)
EOS ABS: 0.3 10*3/uL (ref 0.0–0.7)
EOS PCT: 3 % (ref 0–5)
HCT: 37.1 % (ref 36.0–46.0)
Hemoglobin: 12.4 g/dL (ref 12.0–15.0)
LYMPHS ABS: 2.9 10*3/uL (ref 0.7–4.0)
Lymphocytes Relative: 29 % (ref 12–46)
MCH: 26.9 pg (ref 26.0–34.0)
MCHC: 33.4 g/dL (ref 30.0–36.0)
MCV: 80.5 fL (ref 78.0–100.0)
MPV: 10.8 fL (ref 8.6–12.4)
Monocytes Absolute: 0.8 10*3/uL (ref 0.1–1.0)
Monocytes Relative: 8 % (ref 3–12)
Neutro Abs: 5.8 10*3/uL (ref 1.7–7.7)
Neutrophils Relative %: 59 % (ref 43–77)
PLATELETS: 325 10*3/uL (ref 150–400)
RBC: 4.61 MIL/uL (ref 3.87–5.11)
RDW: 13.6 % (ref 11.5–15.5)
WBC: 9.9 10*3/uL (ref 4.0–10.5)

## 2015-06-27 LAB — HIV ANTIBODY (ROUTINE TESTING W REFLEX): HIV 1&2 Ab, 4th Generation: NONREACTIVE

## 2015-07-02 LAB — CYTOLOGY - PAP

## 2016-03-12 ENCOUNTER — Encounter: Payer: Self-pay | Admitting: Osteopathic Medicine

## 2016-03-12 ENCOUNTER — Ambulatory Visit (INDEPENDENT_AMBULATORY_CARE_PROVIDER_SITE_OTHER): Payer: 59 | Admitting: Osteopathic Medicine

## 2016-03-12 VITALS — BP 138/95 | HR 76 | Temp 98.2°F | Ht 70.0 in | Wt 178.0 lb

## 2016-03-12 DIAGNOSIS — J029 Acute pharyngitis, unspecified: Secondary | ICD-10-CM

## 2016-03-12 DIAGNOSIS — B9689 Other specified bacterial agents as the cause of diseases classified elsewhere: Secondary | ICD-10-CM

## 2016-03-12 DIAGNOSIS — J329 Chronic sinusitis, unspecified: Secondary | ICD-10-CM

## 2016-03-12 DIAGNOSIS — R059 Cough, unspecified: Secondary | ICD-10-CM

## 2016-03-12 DIAGNOSIS — R05 Cough: Secondary | ICD-10-CM | POA: Diagnosis not present

## 2016-03-12 MED ORDER — IPRATROPIUM BROMIDE 0.03 % NA SOLN: 2.0000 | Freq: Three times a day (TID) | NASAL | 1 refills | Status: DC

## 2016-03-12 MED ORDER — LIDOCAINE VISCOUS HCL 2 % MT SOLN: 10.0000 mL | OROMUCOSAL | 1 refills | Status: DC | PRN

## 2016-03-12 MED ORDER — DOXYCYCLINE MONOHYDRATE 100 MG PO CAPS: 100.0000 mg | ORAL_CAPSULE | Freq: Two times a day (BID) | ORAL | 0 refills | Status: DC

## 2016-03-12 MED ORDER — GUAIFENESIN-CODEINE 100-10 MG/5ML PO SYRP: 5.0000 mL | ORAL_SOLUTION | Freq: Three times a day (TID) | ORAL | 0 refills | Status: DC | PRN

## 2016-03-12 NOTE — Progress Notes (Signed)
HPI: Madeline Perkins is a 30 y.o. female who presents to [facility] 03/12/16 for chief complaint of:  Chief Complaint  Patient presents with  . Sinus Problem    Acute Illness: . Location: sinuses, throat, ears . Quality: congestion, sore throat . Assoc signs/symptoms: hoarsenes,, elev temp 99-100 . Duration: 10+ days . Modifying factors: has tried OTC/Rx medications which have not helped   Past medical, social and family history reviewed. Current medications and allergies reviewed.     Review of Systems:  Constitutional: Yes  fever/chills  HEENT: Yes  headache, Yes  sore throat, No  swollen glands  Cardiovascular: No chest pain  Respiratory:Yes  cough, No  shortness of breath  Gastrointestinal: No  nausea, No  vomiting,  No  diarrhea  Musculoskeletal:   No  myalgia/arthralgia  Skin/Integument:  No  rash   Exam:  BP (!) 138/95   Pulse 76   Temp 98.2 F (36.8 C) (Oral)   Ht 5\' 10"  (1.778 m)   Wt 178 lb (80.7 kg)   BMI 25.54 kg/m   Constitutional: VSS, see above. General Appearance: alert, well-developed, well-nourished, NAD  Eyes: Normal lids and conjunctive, non-icteric sclera, PERRLA  Ears, Nose, Mouth, Throat: Normal external inspection ears/nares/mouth/lips/gums, normal TM, MMM; posterior pharynx with erythema, without exudate, nasal mucosa normal  Neck: No masses, trachea midline. normal lymph nodes  Respiratory: Normal respiratory effort. No  wheeze/rhonchi/rales  Cardiovascular: S1/S2 normal, no murmur/rub/gallop auscultated. RRR.    ASSESSMENT/PLAN: Symptoms and exam consistent with bacterial sinusitis/postnasal drip causing significant sore throat problems, possible viral pharyngitis as well, no cervical lymphadenopathy for tonsillar exudate. Lungs are clear. Penicillin allergy, will treat sinusitis with antibiotics as below, since Medicare, RTC/seek attention if no better or if worse. Work note was declined by  patient  Bacterial sinusitis - Plan: doxycycline (MONODOX) 100 MG capsule, ipratropium (ATROVENT) 0.03 % nasal spray  Cough - Plan: guaiFENesin-codeine (ROBITUSSIN AC) 100-10 MG/5ML syrup  Acute pharyngitis, unspecified etiology - Plan: Lidocaine HCl 2 % SOLN     Patient Instructions  Aches/Pains, Fever Acetaminophen (Tylenol) 500 mg tablets - take max 2 tablets (1000 mg) every 6 hours (4 times per day)  Ibuprofen (Motrin) 200 mg tablets - take max 4 tablets (800 mg) every 6 hours  Sinus Congestion Prescription Atrovent as directed Cromolyn Nasal Spray (NasalCrom) 1 spray each nostril 3-4 times per day, max 6 imes per day Nasal Saline if desired Oxymetolazone (Afrin, others) sparing use due to rebound congestion Phenylephrine PE (Sudafed) 10 mg tablets every 4 hours (or the 12-hour formulation) Diphenhydramine (Benadryl) 25 mg tablets - take max 2 tablets every 4 hours  Cough/Sore Throat Dextromethorphan (Robitussin, others) - cough suppressant Guaifenesin (Robitussin, Mucinex, others) - expectorant (helps cough up mucus) The above medications also come in a combination tablet Lozenges w/ Benzocaine + Menthol (Cepacol) Honey - as much as you want Teas which "coat the throat" - look for ingredients Elm Bark, Licorice Root, Marshmallow Root  Other Zinc Lozenges within 24 hours of symptoms onset - mixed evidence this shortens the duration of the common cold Don't waste your money on Vitamin C or Echinacea       Visit summary was printed for the patient with medications and pertinent instructions for patient to review. ER/RTC precautions reviewed. All questions answered. Return if symptoms worsen or fail to improve.

## 2016-03-12 NOTE — Patient Instructions (Signed)
Aches/Pains, Fever Acetaminophen (Tylenol) 500 mg tablets - take max 2 tablets (1000 mg) every 6 hours (4 times per day)  Ibuprofen (Motrin) 200 mg tablets - take max 4 tablets (800 mg) every 6 hours  Sinus Congestion Prescription Atrovent as directed Cromolyn Nasal Spray (NasalCrom) 1 spray each nostril 3-4 times per day, max 6 imes per day Nasal Saline if desired Oxymetolazone (Afrin, others) sparing use due to rebound congestion Phenylephrine PE (Sudafed) 10 mg tablets every 4 hours (or the 12-hour formulation) Diphenhydramine (Benadryl) 25 mg tablets - take max 2 tablets every 4 hours  Cough/Sore Throat Dextromethorphan (Robitussin, others) - cough suppressant Guaifenesin (Robitussin, Mucinex, others) - expectorant (helps cough up mucus) The above medications also come in a combination tablet Lozenges w/ Benzocaine + Menthol (Cepacol) Honey - as much as you want Teas which "coat the throat" - look for ingredients Elm Bark, Licorice Root, Marshmallow Root  Other Zinc Lozenges within 24 hours of symptoms onset - mixed evidence this shortens the duration of the common cold Don't waste your money on Vitamin C or Echinacea

## 2016-04-21 NOTE — L&D Delivery Note (Signed)
Delivery Note At 4:50 PM a viable female was delivered via Vaginal, Spontaneous Delivery   APGAR: 9, 9; weight  pending .   Placenta status:spontaneously with 3 vessel cord double nuchal cord  , .  Cord:  with the following complications:none .  Cord pH: not obtained  Anesthesia:  epidural Episiotomy: None Lacerations: 2nd degree Suture Repair: 3.0 chromic Est. Blood Loss (mL): 300  Mom to postpartum.  Baby to skin to skin.  Maame Dack L 02/06/2017, 5:01 PM

## 2016-06-02 ENCOUNTER — Encounter: Payer: Self-pay | Admitting: Osteopathic Medicine

## 2016-06-02 ENCOUNTER — Ambulatory Visit (INDEPENDENT_AMBULATORY_CARE_PROVIDER_SITE_OTHER): Payer: 59 | Admitting: Osteopathic Medicine

## 2016-06-02 VITALS — BP 115/84 | HR 81 | Ht 70.0 in | Wt 179.0 lb

## 2016-06-02 DIAGNOSIS — Z3200 Encounter for pregnancy test, result unknown: Secondary | ICD-10-CM | POA: Diagnosis not present

## 2016-06-02 LAB — POCT URINE PREGNANCY: Preg Test, Ur: NEGATIVE

## 2016-06-02 NOTE — Progress Notes (Signed)
HPI: Madeline Perkins is a 31 y.o. female  who presents to [facility] today, 06/02/16,  for chief complaint of:  Chief Complaint  Patient presents with  . Possible Pregnancy    Previously separated currently working things out with her husband. No missed period yet but she had a positive home pregnancy test 2 and would like confirmation. LMP 05/10/2016  Past medical history, surgical history, social history and family history reviewed.  Patient Active Problem List   Diagnosis Date Noted  . Palpitations 07/10/2014  . 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 Postop check 05/25/2012    Current medication list and allergy/intolerance information reviewed.   Allergies  Allergen Reactions  . Penicillins Hives    Childhood allergy      Review of Systems:  Constitutional: No recent illness, feeling well today  GU/Abd: No pelvic cramping, no unusual vaginal discharge/bleeding  Exam:  BP 115/84   Pulse 81   Ht 5\' 10"  (1.778 m)   Wt 179 lb (81.2 kg)   BMI 25.68 kg/m  LMP [id number] - certain   Constitutional: VS see above. General Appearance: alert, well-developed, well-nourished, NAD  Psychiatric: Normal judgment/insight. Normal mood and affect. Oriented x3.   Results for orders placed or performed in visit on 06/02/16 (from the past 24 hour(s))  POCT urine pregnancy     Status: None   Collection Time: 06/02/16  3:32 PM  Result Value Ref Range   Preg Test, Ur Negative Negative     ASSESSMENT/PLAN:   G0  We'll get quantitative hCG, patient advised if this is positive and she does have. His symptoms/bleeding, we'll need to come back for further labs. If negative and she has a normal period, likely will not require any further workup but should probably confirm again in the next couple weeks  Possible pregnancy, not confirmed - Plan: POCT urine pregnancy, hCG, quantitative, pregnancy     Follow-up plan: Return depending on labs.  Visit  summary with medication list and pertinent instructions was printed for patient to review, alert [place] if any changes needed. All questions at time of visit were answered - patient instructed to contact office with any additional concerns. ER/RTC precautions were reviewed with the patient and understanding verbalized.

## 2016-06-03 LAB — HCG, QUANTITATIVE, PREGNANCY: HCG, BETA CHAIN, QUANT, S: 15.3 m[IU]/mL — AB

## 2016-07-01 DIAGNOSIS — N911 Secondary amenorrhea: Secondary | ICD-10-CM | POA: Diagnosis not present

## 2016-07-08 DIAGNOSIS — Z3685 Encounter for antenatal screening for Streptococcus B: Secondary | ICD-10-CM | POA: Diagnosis not present

## 2016-07-08 DIAGNOSIS — Z3401 Encounter for supervision of normal first pregnancy, first trimester: Secondary | ICD-10-CM | POA: Diagnosis not present

## 2016-07-29 DIAGNOSIS — Z113 Encounter for screening for infections with a predominantly sexual mode of transmission: Secondary | ICD-10-CM | POA: Diagnosis not present

## 2016-07-29 DIAGNOSIS — Z3401 Encounter for supervision of normal first pregnancy, first trimester: Secondary | ICD-10-CM | POA: Diagnosis not present

## 2016-07-29 DIAGNOSIS — Z01419 Encounter for gynecological examination (general) (routine) without abnormal findings: Secondary | ICD-10-CM | POA: Diagnosis not present

## 2016-08-05 DIAGNOSIS — Z3491 Encounter for supervision of normal pregnancy, unspecified, first trimester: Secondary | ICD-10-CM | POA: Diagnosis not present

## 2016-08-05 DIAGNOSIS — Z3682 Encounter for antenatal screening for nuchal translucency: Secondary | ICD-10-CM | POA: Diagnosis not present

## 2016-08-05 DIAGNOSIS — Z3A12 12 weeks gestation of pregnancy: Secondary | ICD-10-CM | POA: Diagnosis not present

## 2016-08-05 DIAGNOSIS — Z36 Encounter for antenatal screening for chromosomal anomalies: Secondary | ICD-10-CM | POA: Diagnosis not present

## 2016-09-30 DIAGNOSIS — Z3A20 20 weeks gestation of pregnancy: Secondary | ICD-10-CM | POA: Diagnosis not present

## 2016-09-30 DIAGNOSIS — Z3400 Encounter for supervision of normal first pregnancy, unspecified trimester: Secondary | ICD-10-CM | POA: Diagnosis not present

## 2016-09-30 DIAGNOSIS — Z363 Encounter for antenatal screening for malformations: Secondary | ICD-10-CM | POA: Diagnosis not present

## 2016-09-30 DIAGNOSIS — Z3480 Encounter for supervision of other normal pregnancy, unspecified trimester: Secondary | ICD-10-CM | POA: Diagnosis not present

## 2016-10-29 DIAGNOSIS — Z3400 Encounter for supervision of normal first pregnancy, unspecified trimester: Secondary | ICD-10-CM | POA: Diagnosis not present

## 2016-10-29 DIAGNOSIS — Z362 Encounter for other antenatal screening follow-up: Secondary | ICD-10-CM | POA: Diagnosis not present

## 2016-11-24 DIAGNOSIS — Z3480 Encounter for supervision of other normal pregnancy, unspecified trimester: Secondary | ICD-10-CM | POA: Diagnosis not present

## 2016-11-24 DIAGNOSIS — Z3403 Encounter for supervision of normal first pregnancy, third trimester: Secondary | ICD-10-CM | POA: Diagnosis not present

## 2016-11-24 DIAGNOSIS — Z23 Encounter for immunization: Secondary | ICD-10-CM | POA: Diagnosis not present

## 2016-12-11 DIAGNOSIS — O99810 Abnormal glucose complicating pregnancy: Secondary | ICD-10-CM | POA: Diagnosis not present

## 2017-01-15 DIAGNOSIS — Z3480 Encounter for supervision of other normal pregnancy, unspecified trimester: Secondary | ICD-10-CM | POA: Diagnosis not present

## 2017-01-19 DIAGNOSIS — Z3480 Encounter for supervision of other normal pregnancy, unspecified trimester: Secondary | ICD-10-CM | POA: Diagnosis not present

## 2017-01-23 ENCOUNTER — Encounter (HOSPITAL_COMMUNITY): Payer: Self-pay | Admitting: *Deleted

## 2017-01-23 ENCOUNTER — Inpatient Hospital Stay (HOSPITAL_COMMUNITY)
Admission: AD | Admit: 2017-01-23 | Discharge: 2017-01-23 | Disposition: A | Discharge status: Home / Self Care | Payer: 59 | Source: Ambulatory Visit | Attending: Obstetrics & Gynecology | Admitting: Obstetrics & Gynecology

## 2017-01-23 DIAGNOSIS — R519 Headache, unspecified: Secondary | ICD-10-CM

## 2017-01-23 DIAGNOSIS — O26893 Other specified pregnancy related conditions, third trimester: Secondary | ICD-10-CM | POA: Insufficient documentation

## 2017-01-23 DIAGNOSIS — O9989 Other specified diseases and conditions complicating pregnancy, childbirth and the puerperium: Secondary | ICD-10-CM

## 2017-01-23 DIAGNOSIS — R030 Elevated blood-pressure reading, without diagnosis of hypertension: Secondary | ICD-10-CM | POA: Diagnosis not present

## 2017-01-23 DIAGNOSIS — R0989 Other specified symptoms and signs involving the circulatory and respiratory systems: Secondary | ICD-10-CM | POA: Diagnosis not present

## 2017-01-23 DIAGNOSIS — Z3A36 36 weeks gestation of pregnancy: Secondary | ICD-10-CM | POA: Insufficient documentation

## 2017-01-23 DIAGNOSIS — R51 Headache: Secondary | ICD-10-CM

## 2017-01-23 LAB — URINALYSIS, ROUTINE W REFLEX MICROSCOPIC
Bilirubin Urine: NEGATIVE
Glucose, UA: 150 mg/dL — AB
Hgb urine dipstick: NEGATIVE
KETONES UR: NEGATIVE mg/dL
Nitrite: NEGATIVE
Protein, ur: NEGATIVE mg/dL
Specific Gravity, Urine: 1.014 (ref 1.005–1.030)
pH: 5 (ref 5.0–8.0)

## 2017-01-23 LAB — COMPREHENSIVE METABOLIC PANEL
ALK PHOS: 129 U/L — AB (ref 38–126)
ALT: 15 U/L (ref 14–54)
ANION GAP: 8 (ref 5–15)
AST: 16 U/L (ref 15–41)
Albumin: 3 g/dL — ABNORMAL LOW (ref 3.5–5.0)
BILIRUBIN TOTAL: 0.4 mg/dL (ref 0.3–1.2)
BUN: 8 mg/dL (ref 6–20)
CALCIUM: 9.1 mg/dL (ref 8.9–10.3)
CO2: 22 mmol/L (ref 22–32)
Chloride: 104 mmol/L (ref 101–111)
Creatinine, Ser: 0.66 mg/dL (ref 0.44–1.00)
GFR calc non Af Amer: >60 mL/min (ref >60)
Glucose, Bld: 75 mg/dL (ref 65–99)
Potassium: 4.1 mmol/L (ref 3.5–5.1)
SODIUM: 134 mmol/L — AB (ref 135–145)
TOTAL PROTEIN: 6.9 g/dL (ref 6.5–8.1)

## 2017-01-23 LAB — CBC
HCT: 36.2 % (ref 36.0–46.0)
HEMOGLOBIN: 11.8 g/dL — AB (ref 12.0–15.0)
MCH: 26.8 pg (ref 26.0–34.0)
MCHC: 32.6 g/dL (ref 30.0–36.0)
MCV: 82.1 fL (ref 78.0–100.0)
Platelets: 296 10*3/uL (ref 150–400)
RBC: 4.41 MIL/uL (ref 3.87–5.11)
RDW: 14 % (ref 11.5–15.5)
WBC: 14.9 10*3/uL — AB (ref 4.0–10.5)

## 2017-01-23 LAB — PROTEIN / CREATININE RATIO, URINE
Creatinine, Urine: 104 mg/dL
PROTEIN CREATININE RATIO: 0.08 mg/mg{creat} (ref 0.00–0.15)
TOTAL PROTEIN, URINE: 8 mg/dL

## 2017-01-23 NOTE — Discharge Instructions (Signed)
Hypertension During Pregnancy °Hypertension, commonly called high blood pressure, is when the force of blood pumping through your arteries is too strong. Arteries are blood vessels that carry blood from the heart throughout the body. Hypertension during pregnancy can cause problems for you and your baby. Your baby may be born early (prematurely) or may not weigh as much as he or she should at birth. Very bad cases of hypertension during pregnancy can be life-threatening. °Different types of hypertension can occur during pregnancy. These include: °· Chronic hypertension. This happens when: °? You have hypertension before pregnancy and it continues during pregnancy. °? You develop hypertension before you are [redacted] weeks pregnant, and it continues during pregnancy. °· Gestational hypertension. This is hypertension that develops after the 20th week of pregnancy. °· Preeclampsia, also called toxemia of pregnancy. This is a very serious type of hypertension that develops only during pregnancy. It affects the whole body, and it can be very dangerous for you and your baby. ° °Gestational hypertension and preeclampsia usually go away within 6 weeks after your baby is born. Women who have hypertension during pregnancy have a greater chance of developing hypertension later in life or during future pregnancies. °What are the causes? °The exact cause of hypertension is not known. °What increases the risk? °There are certain factors that make it more likely for you to develop hypertension during pregnancy. These include: °· Having hypertension during a previous pregnancy or prior to pregnancy. °· Being overweight. °· Being older than age 40. °· Being pregnant for the first time or being pregnant with more than one baby. °· Becoming pregnant using fertilization methods such as IVF (in vitro fertilization). °· Having diabetes, kidney problems, or systemic lupus erythematosus. °· Having a family history of hypertension. ° °What are the  signs or symptoms? °Chronic hypertension and gestational hypertension rarely cause symptoms. Preeclampsia causes symptoms, which may include: °· Increased protein in your urine. Your health care provider will check for this at every visit before you give birth (prenatal visit). °· Severe headaches. °· Sudden weight gain. °· Swelling of the hands, face, legs, and feet. °· Nausea and vomiting. °· Vision problems, such as blurred or double vision. °· Numbness in the face, arms, legs, and feet. °· Dizziness. °· Slurred speech. °· Sensitivity to bright lights. °· Abdominal pain. °· Convulsions. ° °How is this diagnosed? °You may be diagnosed with hypertension during a routine prenatal exam. At each prenatal visit, you may: °· Have a urine test to check for high amounts of protein in your urine. °· Have your blood pressure checked. A blood pressure reading is recorded as two numbers, such as "120 over 80" (or 120/80). The first ("top") number is called the systolic pressure. It is a measure of the pressure in your arteries when your heart beats. The second ("bottom") number is called the diastolic pressure. It is a measure of the pressure in your arteries as your heart relaxes between beats. Blood pressure is measured in a unit called mm Hg. A normal blood pressure reading is: °? Systolic: below 120. °? Diastolic: below 80. ° °The type of hypertension that you are diagnosed with depends on your test results and when your symptoms developed. °· Chronic hypertension is usually diagnosed before 20 weeks of pregnancy. °· Gestational hypertension is usually diagnosed after 20 weeks of pregnancy. °· Hypertension with high amounts of protein in the urine is diagnosed as preeclampsia. °· Blood pressure measurements that stay above 160 systolic, or above 110 diastolic, are   signs of severe preeclampsia. ° °How is this treated? °Treatment for hypertension during pregnancy varies depending on the type of hypertension you have and how  serious it is. °· If you take medicines called ACE inhibitors to treat chronic hypertension, you may need to switch medicines. ACE inhibitors should not be taken during pregnancy. °· If you have gestational hypertension, you may need to take blood pressure medicine. °· If you are at risk for preeclampsia, your health care provider may recommend that you take a low-dose aspirin every day to prevent high blood pressure during your pregnancy. °· If you have severe preeclampsia, you may need to be hospitalized so you and your baby can be monitored closely. You may also need to take medicine (magnesium sulfate) to prevent seizures and to lower blood pressure. This medicine may be given as an injection or through an IV tube. °· In some cases, if your condition gets worse, you may need to deliver your baby early. ° °Follow these instructions at home: °Eating and drinking °· Drink enough fluid to keep your urine clear or pale yellow. °· Eat a healthy diet that is low in salt (sodium). Do not add salt to your food. Check food labels to see how much sodium a food or beverage contains. °Lifestyle °· Do not use any products that contain nicotine or tobacco, such as cigarettes and e-cigarettes. If you need help quitting, ask your health care provider. °· Do not use alcohol. °· Avoid caffeine. °· Avoid stress as much as possible. Rest and get plenty of sleep. °General instructions °· Take over-the-counter and prescription medicines only as told by your health care provider. °· While lying down, lie on your left side. This keeps pressure off your baby. °· While sitting or lying down, raise (elevate) your feet. Try putting some pillows under your lower legs. °· Exercise regularly. Ask your health care provider what kinds of exercise are best for you. °· Keep all prenatal and follow-up visits as told by your health care provider. This is important. °Contact a health care provider if: °· You have symptoms that your health care  provider told you may require more treatment or monitoring, such as: °? Fever. °? Vomiting. °? Headache. °Get help right away if: °· You have severe abdominal pain or vomiting that does not get better with treatment. °· You suddenly develop swelling in your hands, ankles, or face. °· You gain 4 lbs (1.8 kg) or more in 1 week. °· You develop vaginal bleeding, or you have blood in your urine. °· You do not feel your baby moving as much as usual. °· You have blurred or double vision. °· You have muscle twitching or sudden tightening (spasms). °· You have shortness of breath. °· Your lips or fingernails turn blue. °This information is not intended to replace advice given to you by your health care provider. Make sure you discuss any questions you have with your health care provider. °Document Released: 12/24/2010 Document Revised: 10/26/2015 Document Reviewed: 09/21/2015 °Elsevier Interactive Patient Education © 2018 [employer] ° °

## 2017-01-23 NOTE — MAU Provider Note (Signed)
Chief Complaint:  No chief complaint on file.   First Provider Initiated Contact with Patient 01/23/17 1612     HPI: Madeline Perkins is a 31 y.o. [redacted]w[redacted]d who presents to maternity admissions reporting elevated blood pressure at home.  States had headache earlier today but it resolved.  Had some visual changes while driving here but they stopped. . She reports good fetal movement, denies LOF, vaginal bleeding, vaginal itching/burning, urinary symptoms, h/a, dizziness, n/v, diarrhea, constipation or fever/chills.    Hypertension  This is a new problem. The current episode started 1 to 4 weeks ago. The problem is unchanged. Associated symptoms include blurred vision and headaches. Pertinent negatives include no anxiety, malaise/fatigue, neck pain, peripheral edema, shortness of breath or sweats. There are no associated agents to hypertension. There are no known risk factors for coronary artery disease. Past treatments include nothing. There are no compliance problems.     RN Note: Pt presents with c/o ^BP @ home, states BP 141/110 when taken @ 1430.  Denies visual disturbances, HA, or epigastric pain.  States was taken out of work yesterday d/t ^BP.  Past Medical History: Past Medical History:  Diagnosis Date  . Cancer ([place])    melanoma  . GERD (gastroesophageal reflux disease)   . Intermittent palpitations    w/u by cardio neg (Dr [facility] Breed), Toprol prn    Past obstetric history: OB History  No data available    Past Surgical History: Past Surgical History:  Procedure Laterality Date  . BREAST ENHANCEMENT SURGERY    . LAPAROSCOPIC APPENDECTOMY  05/03/2012   Procedure: APPENDECTOMY LAPAROSCOPIC;  Surgeon: Gwenyth Ober, MD;  Location: [facility];  Service: General;  Laterality: N/A;  . MELANOMA EXCISION WITH SENTINEL LYMPH NODE BIOPSY N/A 08/10/2012   Procedure: RE-EXCISION BACK MELANOMA WITH SENTINEL LEFT INGUINAL LYMPH NODE BIOPSY;  Surgeon: Gwenyth Ober, MD;  Location: [facility];  Service:  General;  Laterality: N/A;  . WISDOM TOOTH EXTRACTION  Nov 2009    Family History: Family History  Problem Relation Age of Onset  . Other Mother   . Cancer Mother        lymphoma  . Cancer Paternal Grandfather        esophageal  . Hypertension Mother   . Hypertension Father     Social History: Social History  Substance Use Topics  . Smoking status: Never Smoker  . Smokeless tobacco: Never Used  . Alcohol use 0.0 oz/week     Comment: Occasional    Allergies:  Allergies  Allergen Reactions  . Penicillins Hives    Childhood allergy    Meds:  Prescriptions Prior to Admission  Medication Sig Dispense Refill Last Dose  . ALPRAZolam (XANAX) 0.25 MG tablet Take 1 tablet (0.25 mg total) by mouth 2 (two) times daily as needed (panic attacks). 2 TIMES DAILY IS MAXIMUM. Try to limit use to one or two days per week. 20 tablet 0 Taking  . calcium-vitamin D 250-100 MG-UNIT per tablet Take 1 tablet by mouth daily.   Taking  . ipratropium (ATROVENT) 0.03 % nasal spray Place 2 sprays into both nostrils 3 (three) times daily. 30 mL 1 Taking  . Iron 66 MG TABS Take 1 tablet by mouth 4 (four) times a week.    Taking  . Lidocaine HCl 2 % SOLN Use as directed 10-15 mLs in the mouth or throat every 3 (three) hours as needed (mouth/throat pain - gargle and spit, or can swallow). 100 mL 1 Taking  .  metoprolol succinate (TOPROL XL) 25 MG 24 hr tablet Take 1 tablet (25 mg total) by mouth daily as needed. 30 tablet 11 Taking  . Multiple Vitamins-Minerals (MULTIVITAMINS THER. W/MINERALS) TABS Take 1 tablet by mouth daily.   Taking    I have reviewed patient's Past Medical Hx, Surgical Hx, Family Hx, Social Hx, medications and allergies.   ROS:  Review of Systems  Constitutional: Negative for malaise/fatigue.  Eyes: Positive for blurred vision.  Respiratory: Negative for shortness of breath.   Musculoskeletal: Negative for neck pain.  Neurological: Positive for headaches.   Other systems  negative  Physical Exam   Vitals:   01/23/17 1612 01/23/17 1630 01/23/17 1645 01/23/17 1700  BP: 111/74 110/79 112/76 123/77  Pulse: 90 86 84 91  Resp: 20     Temp: 98.6 F (37 C)     TempSrc: Oral     SpO2: 99%       Constitutional: Well-developed, well-nourished female in no acute distress.  Cardiovascular: normal rate and rhythm Respiratory: normal effort, clear to auscultation bilaterally GI: Abd soft, non-tender, gravid appropriate for gestational age.   No rebound or guarding. MS: Extremities nontender, no edema, normal ROM Neurologic: Alert and oriented x 4.  GU: Neg CVAT.  PELVIC EXAM: deferred   FHT:  Baseline 140 , moderate variability, accelerations present, no decelerations Contractions:  Irregular  And Rare   Labs: Results for orders placed or performed during the hospital encounter of 01/23/17 (from the past 24 hour(s))  Urinalysis, Routine w reflex microscopic     Status: Abnormal   Collection Time: 01/23/17  4:08 PM  Result Value Ref Range   Color, Urine YELLOW YELLOW   APPearance HAZY (A) CLEAR   Specific Gravity, Urine 1.014 1.005 - 1.030   pH 5.0 5.0 - 8.0   Glucose, UA 150 (A) NEGATIVE mg/dL   Hgb urine dipstick NEGATIVE NEGATIVE   Bilirubin Urine NEGATIVE NEGATIVE   Ketones, ur NEGATIVE NEGATIVE mg/dL   Protein, ur NEGATIVE NEGATIVE mg/dL   Nitrite NEGATIVE NEGATIVE   Leukocytes, UA TRACE (A) NEGATIVE   RBC / HPF 0-5 0 - 5 RBC/hpf   WBC, UA 6-30 0 - 5 WBC/hpf   Bacteria, UA FEW (A) NONE SEEN   Squamous Epithelial / LPF 6-30 (A) NONE SEEN   Mucus PRESENT   CBC     Status: Abnormal   Collection Time: 01/23/17  4:09 PM  Result Value Ref Range   WBC 14.9 (H) 4.0 - 10.5 K/uL   RBC 4.41 3.87 - 5.11 MIL/uL   Hemoglobin 11.8 (L) 12.0 - 15.0 g/dL   HCT 36.2 36.0 - 46.0 %   MCV 82.1 78.0 - 100.0 fL   MCH 26.8 26.0 - 34.0 pg   MCHC 32.6 30.0 - 36.0 g/dL   RDW 14.0 11.5 - 15.5 %   Platelets 296 150 - 400 K/uL  Comprehensive metabolic panel      Status: Abnormal   Collection Time: 01/23/17  4:09 PM  Result Value Ref Range   Sodium 134 (L) 135 - 145 mmol/L   Potassium 4.1 3.5 - 5.1 mmol/L   Chloride 104 101 - 111 mmol/L   CO2 22 22 - 32 mmol/L   Glucose, Bld 75 65 - 99 mg/dL   BUN 8 6 - 20 mg/dL   Creatinine, Ser 0.66 0.44 - 1.00 mg/dL   Calcium 9.1 8.9 - 10.3 mg/dL   Total Protein 6.9 6.5 - 8.1 g/dL   Albumin 3.0 (L) 3.5 -  5.0 g/dL   AST 16 15 - 41 U/L   ALT 15 14 - 54 U/L   Alkaline Phosphatase 129 (H) 38 - 126 U/L   Total Bilirubin 0.4 0.3 - 1.2 mg/dL   GFR calc non Af Amer >60 >60 mL/min   GFR calc Af Amer >60 >60 mL/min   Anion gap 8 5 - 15  Protein / creatinine ratio, urine     Status: None   Collection Time: 01/23/17  4:10 PM  Result Value Ref Range   Creatinine, Urine 104.00 mg/dL   Total Protein, Urine 8 mg/dL   Protein Creatinine Ratio 0.08 0.00 - 0.15 mg/mg[Cre]       Imaging:  No results found.  MAU Course/MDM: I have ordered labs and reviewed results.  NST reviewed and found to be reactive., category I Consult Dr Lynnette Caffey with presentation, exam findings and test results.  Treatments in MAU included EFM.    Assessment: Single IUP at [redacted]w[redacted]d Elevated blood pressure at home Normal Blood pressure here Labile blood pressure   Plan: Discharge home Preeclampsia precautions Labor precautions and fetal kick counts Follow up in Office for prenatal visits and recheck of BP early next week  Encouraged to return here or to other Urgent Care/ED if she develops worsening of symptoms, increase in pain, fever, or other concerning symptoms.   Pt stable at time of discharge.  Hansel Feinstein CNM, MSN Certified Nurse-Midwife 01/23/2017 4:12 PM

## 2017-01-23 NOTE — MAU Note (Signed)
Pt presents with c/o ^BP @ home, states BP 141/110 when taken @ 1430.  Denies visual disturbances, HA, or epigastric pain.  States was taken out of work yesterday d/t ^BP.

## 2017-01-26 DIAGNOSIS — Z3A37 37 weeks gestation of pregnancy: Secondary | ICD-10-CM | POA: Diagnosis not present

## 2017-01-26 DIAGNOSIS — Z3480 Encounter for supervision of other normal pregnancy, unspecified trimester: Secondary | ICD-10-CM | POA: Diagnosis not present

## 2017-01-26 DIAGNOSIS — Z3400 Encounter for supervision of normal first pregnancy, unspecified trimester: Secondary | ICD-10-CM | POA: Diagnosis not present

## 2017-01-26 DIAGNOSIS — O26893 Other specified pregnancy related conditions, third trimester: Secondary | ICD-10-CM | POA: Diagnosis not present

## 2017-01-29 DIAGNOSIS — Z3400 Encounter for supervision of normal first pregnancy, unspecified trimester: Secondary | ICD-10-CM | POA: Diagnosis not present

## 2017-01-29 DIAGNOSIS — O9989 Other specified diseases and conditions complicating pregnancy, childbirth and the puerperium: Secondary | ICD-10-CM | POA: Diagnosis not present

## 2017-01-29 DIAGNOSIS — Z3A37 37 weeks gestation of pregnancy: Secondary | ICD-10-CM | POA: Diagnosis not present

## 2017-01-30 ENCOUNTER — Telehealth (HOSPITAL_COMMUNITY): Payer: Self-pay | Admitting: *Deleted

## 2017-01-30 ENCOUNTER — Encounter (HOSPITAL_COMMUNITY): Payer: Self-pay | Admitting: *Deleted

## 2017-01-30 NOTE — Telephone Encounter (Signed)
Preadmission screen  

## 2017-02-02 DIAGNOSIS — Z3A38 38 weeks gestation of pregnancy: Secondary | ICD-10-CM | POA: Diagnosis not present

## 2017-02-02 DIAGNOSIS — Z3400 Encounter for supervision of normal first pregnancy, unspecified trimester: Secondary | ICD-10-CM | POA: Diagnosis not present

## 2017-02-02 DIAGNOSIS — O368330 Maternal care for abnormalities of the fetal heart rate or rhythm, third trimester, not applicable or unspecified: Secondary | ICD-10-CM | POA: Diagnosis not present

## 2017-02-03 ENCOUNTER — Telehealth (HOSPITAL_COMMUNITY): Payer: Self-pay | Admitting: *Deleted

## 2017-02-03 ENCOUNTER — Encounter (HOSPITAL_COMMUNITY): Payer: Self-pay | Admitting: *Deleted

## 2017-02-03 NOTE — Telephone Encounter (Signed)
Preadmission screen  

## 2017-02-06 ENCOUNTER — Encounter (HOSPITAL_COMMUNITY): Payer: Self-pay

## 2017-02-06 ENCOUNTER — Inpatient Hospital Stay (HOSPITAL_COMMUNITY): Payer: 59 | Admitting: Anesthesiology

## 2017-02-06 ENCOUNTER — Inpatient Hospital Stay (HOSPITAL_COMMUNITY)
Admission: RE | Admit: 2017-02-06 | Discharge: 2017-02-08 | DRG: 807 | Disposition: A | Discharge status: Home / Self Care | Payer: 59 | Source: Ambulatory Visit | Attending: Obstetrics and Gynecology | Admitting: Obstetrics and Gynecology

## 2017-02-06 VITALS — BP 104/61 | HR 76 | Temp 98.0°F | Resp 16 | Ht 71.0 in | Wt 221.0 lb

## 2017-02-06 DIAGNOSIS — Z3A38 38 weeks gestation of pregnancy: Secondary | ICD-10-CM

## 2017-02-06 DIAGNOSIS — O9962 Diseases of the digestive system complicating childbirth: Secondary | ICD-10-CM | POA: Diagnosis present

## 2017-02-06 DIAGNOSIS — K219 Gastro-esophageal reflux disease without esophagitis: Secondary | ICD-10-CM | POA: Diagnosis present

## 2017-02-06 DIAGNOSIS — O134 Gestational [pregnancy-induced] hypertension without significant proteinuria, complicating childbirth: Secondary | ICD-10-CM | POA: Diagnosis not present

## 2017-02-06 DIAGNOSIS — O139 Gestational [pregnancy-induced] hypertension without significant proteinuria, unspecified trimester: Secondary | ICD-10-CM | POA: Diagnosis present

## 2017-02-06 DIAGNOSIS — O133 Gestational [pregnancy-induced] hypertension without significant proteinuria, third trimester: Secondary | ICD-10-CM

## 2017-02-06 LAB — CBC WITH DIFFERENTIAL/PLATELET
BASOS ABS: 0 10*3/uL (ref 0.0–0.1)
BASOS PCT: 0 %
Basophils Absolute: 0 10*3/uL (ref 0.0–0.1)
Basophils Relative: 0 %
EOS ABS: 0.1 10*3/uL (ref 0.0–0.7)
EOS ABS: 0.1 10*3/uL (ref 0.0–0.7)
EOS PCT: 1 %
EOS PCT: 0 %
HCT: 34.1 % — ABNORMAL LOW (ref 36.0–46.0)
HEMATOCRIT: 34.7 % — AB (ref 36.0–46.0)
Hemoglobin: 11 g/dL — ABNORMAL LOW (ref 12.0–15.0)
Hemoglobin: 11.2 g/dL — ABNORMAL LOW (ref 12.0–15.0)
LYMPHS ABS: 2.4 10*3/uL (ref 0.7–4.0)
LYMPHS PCT: 19 %
Lymphocytes Relative: 10 %
Lymphs Abs: 1.6 10*3/uL (ref 0.7–4.0)
MCH: 27 pg (ref 26.0–34.0)
MCH: 27.1 pg (ref 26.0–34.0)
MCHC: 32.3 g/dL (ref 30.0–36.0)
MCHC: 32.3 g/dL (ref 30.0–36.0)
MCV: 83.8 fL (ref 78.0–100.0)
MCV: 83.8 fL (ref 78.0–100.0)
MONO ABS: 0.4 10*3/uL (ref 0.1–1.0)
MONO ABS: 1.1 10*3/uL — AB (ref 0.1–1.0)
MONOS PCT: 7 %
Monocytes Relative: 4 %
NEUTROS ABS: 12.8 10*3/uL — AB (ref 1.7–7.7)
Neutro Abs: 9.7 10*3/uL — ABNORMAL HIGH (ref 1.7–7.7)
Neutrophils Relative %: 76 %
Neutrophils Relative %: 83 %
PLATELETS: 228 10*3/uL (ref 150–400)
PLATELETS: 235 10*3/uL (ref 150–400)
RBC: 4.07 MIL/uL (ref 3.87–5.11)
RBC: 4.14 MIL/uL (ref 3.87–5.11)
RDW: 15 % (ref 11.5–15.5)
RDW: 15.1 % (ref 11.5–15.5)
WBC: 12.8 10*3/uL — ABNORMAL HIGH (ref 4.0–10.5)
WBC: 15.5 10*3/uL — ABNORMAL HIGH (ref 4.0–10.5)

## 2017-02-06 LAB — TYPE AND SCREEN
ABO/RH(D): O POS
ANTIBODY SCREEN: NEGATIVE

## 2017-02-06 LAB — CBC
HCT: 33.8 % — ABNORMAL LOW (ref 36.0–46.0)
Hemoglobin: 11 g/dL — ABNORMAL LOW (ref 12.0–15.0)
MCH: 27 pg (ref 26.0–34.0)
MCHC: 32.5 g/dL (ref 30.0–36.0)
MCV: 83 fL (ref 78.0–100.0)
PLATELETS: 250 10*3/uL (ref 150–400)
RBC: 4.07 MIL/uL (ref 3.87–5.11)
RDW: 15.1 % (ref 11.5–15.5)
WBC: 12.9 10*3/uL — ABNORMAL HIGH (ref 4.0–10.5)

## 2017-02-06 LAB — RPR: RPR: NONREACTIVE

## 2017-02-06 MED ORDER — COCONUT OIL OIL
1.0000 "application " | TOPICAL_OIL | Status: DC | PRN
  Administered 2017-02-07: 1 via TOPICAL
  Filled 2017-02-06: qty 120

## 2017-02-06 MED ORDER — OXYCODONE-ACETAMINOPHEN 5-325 MG PO TABS: 1.0000 | ORAL_TABLET | ORAL | Status: DC | PRN

## 2017-02-06 MED ORDER — DIPHENHYDRAMINE HCL 25 MG PO CAPS: 25.0000 mg | ORAL_CAPSULE | Freq: Four times a day (QID) | ORAL | Status: DC | PRN

## 2017-02-06 MED ORDER — LIDOCAINE HCL (PF) 1 % IJ SOLN
INTRAMUSCULAR | Status: DC | PRN
  Administered 2017-02-06: 6 mL via EPIDURAL
  Administered 2017-02-06: 4 mL

## 2017-02-06 MED ORDER — SOD CITRATE-CITRIC ACID 500-334 MG/5ML PO SOLN: 30.0000 mL | ORAL | Status: DC | PRN

## 2017-02-06 MED ORDER — LACTATED RINGERS IV SOLN
500.0000 mL | Freq: Once | INTRAVENOUS | Status: AC
  Administered 2017-02-06: 500 mL via INTRAVENOUS

## 2017-02-06 MED ORDER — BENZOCAINE-MENTHOL 20-0.5 % EX AERO
1.0000 "application " | INHALATION_SPRAY | CUTANEOUS | Status: DC | PRN
  Administered 2017-02-07: 1 via TOPICAL
  Filled 2017-02-06: qty 56

## 2017-02-06 MED ORDER — OXYCODONE-ACETAMINOPHEN 5-325 MG PO TABS: 2.0000 | ORAL_TABLET | ORAL | Status: DC | PRN

## 2017-02-06 MED ORDER — MEDROXYPROGESTERONE ACETATE 150 MG/ML IM SUSP: 150.0000 mg | INTRAMUSCULAR | Status: DC | PRN

## 2017-02-06 MED ORDER — PRENATAL MULTIVITAMIN CH
1.0000 | ORAL_TABLET | Freq: Every day | ORAL | Status: DC
  Administered 2017-02-07 – 2017-02-08 (×2): 1 via ORAL
  Filled 2017-02-06 (×2): qty 1

## 2017-02-06 MED ORDER — PHENYLEPHRINE 40 MCG/ML (10ML) SYRINGE FOR IV PUSH (FOR BLOOD PRESSURE SUPPORT)
PREFILLED_SYRINGE | INTRAVENOUS | Status: AC
  Administered 2017-02-06: 80 ug via INTRAVENOUS
  Filled 2017-02-06: qty 20

## 2017-02-06 MED ORDER — OXYTOCIN BOLUS FROM INFUSION
500.0000 mL | Freq: Once | INTRAVENOUS | Status: AC
  Administered 2017-02-06: 500 mL via INTRAVENOUS

## 2017-02-06 MED ORDER — EPHEDRINE 5 MG/ML INJ
10.0000 mg | INTRAVENOUS | Status: DC | PRN
  Filled 2017-02-06: qty 2

## 2017-02-06 MED ORDER — DIPHENHYDRAMINE HCL 50 MG/ML IJ SOLN: 12.5000 mg | INTRAMUSCULAR | Status: DC | PRN

## 2017-02-06 MED ORDER — PHENYLEPHRINE 40 MCG/ML (10ML) SYRINGE FOR IV PUSH (FOR BLOOD PRESSURE SUPPORT)
80.0000 ug | PREFILLED_SYRINGE | INTRAVENOUS | Status: DC | PRN
Start: 2017-02-06 — End: 2017-02-06
  Administered 2017-02-06 (×2): 80 ug via INTRAVENOUS
  Filled 2017-02-06: qty 5

## 2017-02-06 MED ORDER — FENTANYL 2.5 MCG/ML BUPIVACAINE 1/10 % EPIDURAL INFUSION (WH - ANES)
14.0000 mL/h | INTRAMUSCULAR | Status: DC | PRN
  Administered 2017-02-06: 16 mL/h via EPIDURAL

## 2017-02-06 MED ORDER — MISOPROSTOL 25 MCG QUARTER TABLET
25.0000 ug | ORAL_TABLET | ORAL | Status: DC | PRN
  Administered 2017-02-06: 25 ug via VAGINAL
  Filled 2017-02-06 (×2): qty 1

## 2017-02-06 MED ORDER — LIDOCAINE HCL (PF) 1 % IJ SOLN
30.0000 mL | INTRAMUSCULAR | Status: DC | PRN
  Filled 2017-02-06: qty 30

## 2017-02-06 MED ORDER — DIBUCAINE 1 % RE OINT
1.0000 "application " | TOPICAL_OINTMENT | RECTAL | Status: DC | PRN
  Administered 2017-02-07: 1 via RECTAL
  Filled 2017-02-06: qty 28

## 2017-02-06 MED ORDER — OXYTOCIN 40 UNITS IN LACTATED RINGERS INFUSION - SIMPLE MED
2.5000 [IU]/h | INTRAVENOUS | Status: DC
  Administered 2017-02-06: 2.5 [IU]/h via INTRAVENOUS

## 2017-02-06 MED ORDER — ZOLPIDEM TARTRATE 5 MG PO TABS
5.0000 mg | ORAL_TABLET | Freq: Every evening | ORAL | Status: DC | PRN
Start: 2017-02-06 — End: 2017-02-08

## 2017-02-06 MED ORDER — WITCH HAZEL-GLYCERIN EX PADS
1.0000 "application " | MEDICATED_PAD | CUTANEOUS | Status: DC | PRN
  Administered 2017-02-07: 1 via TOPICAL

## 2017-02-06 MED ORDER — OXYTOCIN 40 UNITS IN LACTATED RINGERS INFUSION - SIMPLE MED
1.0000 m[IU]/min | INTRAVENOUS | Status: DC
  Administered 2017-02-06: 2 m[IU]/min via INTRAVENOUS
  Filled 2017-02-06: qty 1000

## 2017-02-06 MED ORDER — SENNOSIDES-DOCUSATE SODIUM 8.6-50 MG PO TABS
2.0000 | ORAL_TABLET | ORAL | Status: DC
  Administered 2017-02-07 (×2): 2 via ORAL
  Filled 2017-02-06 (×2): qty 2

## 2017-02-06 MED ORDER — FENTANYL 2.5 MCG/ML BUPIVACAINE 1/10 % EPIDURAL INFUSION (WH - ANES)
INTRAMUSCULAR | Status: AC
  Filled 2017-02-06: qty 100

## 2017-02-06 MED ORDER — BISACODYL 10 MG RE SUPP: 10.0000 mg | Freq: Every day | RECTAL | Status: DC | PRN

## 2017-02-06 MED ORDER — ONDANSETRON HCL 4 MG/2ML IJ SOLN: 4.0000 mg | INTRAMUSCULAR | Status: DC | PRN

## 2017-02-06 MED ORDER — TERBUTALINE SULFATE 1 MG/ML IJ SOLN
0.2500 mg | Freq: Once | INTRAMUSCULAR | Status: DC | PRN
  Filled 2017-02-06: qty 1

## 2017-02-06 MED ORDER — ACETAMINOPHEN 325 MG PO TABS
650.0000 mg | ORAL_TABLET | ORAL | Status: DC | PRN
  Administered 2017-02-07: 650 mg via ORAL
  Filled 2017-02-06: qty 2

## 2017-02-06 MED ORDER — LACTATED RINGERS IV SOLN
INTRAVENOUS | Status: DC
  Administered 2017-02-06 (×2): via INTRAVENOUS

## 2017-02-06 MED ORDER — LACTATED RINGERS IV SOLN
500.0000 mL | INTRAVENOUS | Status: DC | PRN
  Administered 2017-02-06: 1000 mL via INTRAVENOUS

## 2017-02-06 MED ORDER — SIMETHICONE 80 MG PO CHEW: 80.0000 mg | CHEWABLE_TABLET | ORAL | Status: DC | PRN

## 2017-02-06 MED ORDER — ONDANSETRON HCL 4 MG/2ML IJ SOLN: 4.0000 mg | Freq: Four times a day (QID) | INTRAMUSCULAR | Status: DC | PRN

## 2017-02-06 MED ORDER — PHENYLEPHRINE 40 MCG/ML (10ML) SYRINGE FOR IV PUSH (FOR BLOOD PRESSURE SUPPORT)
80.0000 ug | PREFILLED_SYRINGE | INTRAVENOUS | Status: DC | PRN
  Filled 2017-02-06: qty 5

## 2017-02-06 MED ORDER — TETANUS-DIPHTH-ACELL PERTUSSIS 5-2.5-18.5 LF-MCG/0.5 IM SUSP: 0.5000 mL | Freq: Once | INTRAMUSCULAR | Status: DC

## 2017-02-06 MED ORDER — FLEET ENEMA 7-19 GM/118ML RE ENEM: 1.0000 | ENEMA | Freq: Every day | RECTAL | Status: DC | PRN

## 2017-02-06 MED ORDER — MEASLES, MUMPS & RUBELLA VAC ~~LOC~~ INJ
0.5000 mL | INJECTION | Freq: Once | SUBCUTANEOUS | Status: AC
  Administered 2017-02-08: 0.5 mL via SUBCUTANEOUS
  Filled 2017-02-06 (×2): qty 0.5

## 2017-02-06 MED ORDER — ONDANSETRON HCL 4 MG PO TABS
4.0000 mg | ORAL_TABLET | ORAL | Status: DC | PRN
Start: 2017-02-06 — End: 2017-02-08

## 2017-02-06 MED ORDER — IBUPROFEN 600 MG PO TABS
600.0000 mg | ORAL_TABLET | Freq: Four times a day (QID) | ORAL | Status: DC
  Administered 2017-02-07 – 2017-02-08 (×7): 600 mg via ORAL
  Filled 2017-02-06 (×7): qty 1

## 2017-02-06 MED ORDER — ACETAMINOPHEN 325 MG PO TABS: 650.0000 mg | ORAL_TABLET | ORAL | Status: DC | PRN

## 2017-02-06 NOTE — H&P (Signed)
Madeline Perkins is a 31 y.o. G 2 P 0 at 42 w 6 days presents for IOL for gestational hypertension Status post cytotec and not on Pitocin Desires epidural prn OB History    Gravida Para Term Preterm AB Living   2       1     SAB TAB Ectopic Multiple Live Births   1             Past Medical History:  Diagnosis Date  . Cancer ([place])    melanoma Stage 4 on loser back  . GERD (gastroesophageal reflux disease)   . Intermittent palpitations    w/u by cardio neg (Dr Stanford Breed), Toprol prn  . Pregnancy induced hypertension   . Situational anxiety    Past Surgical History:  Procedure Laterality Date  . APPENDECTOMY    . BREAST ENHANCEMENT SURGERY    . BREAST SURGERY    . LAPAROSCOPIC APPENDECTOMY  05/03/2012   Procedure: APPENDECTOMY LAPAROSCOPIC;  Surgeon: Gwenyth Ober, MD;  Location: [facility];  Service: General;  Laterality: N/A;  . MELANOMA EXCISION WITH SENTINEL LYMPH NODE BIOPSY N/A 08/10/2012   Procedure: RE-EXCISION BACK MELANOMA WITH SENTINEL LEFT INGUINAL LYMPH NODE BIOPSY;  Surgeon: Gwenyth Ober, MD;  Location: [facility] [place] [facility];  Service: General;  Laterality: N/A;  . WISDOM TOOTH EXTRACTION  Nov 2009   Family History: family history includes Cancer in her mother and paternal grandfather; Diabetes in her maternal grandmother; Hypertension in her father and mother; Other in her mother. Social History:  reports that she has never smoked. She has never used smokeless tobacco. She reports that she drinks alcohol. She reports that she does not use drugs.     Maternal Diabetes: No Genetic Screening: Normal Maternal Ultrasounds/Referrals: Normal Fetal Ultrasounds or other Referrals:  None Maternal Substance Abuse:  No Significant Maternal Medications:  None Significant Maternal Lab Results:  None Other Comments:  None  Review of Systems  All other systems reviewed and are negative.  History Dilation: 3.5 Effacement (%): 50 Station: -3 Exam by:: Catie Conley Canal, RN Blood pressure 127/88,  pulse 76, temperature 97.9 F (36.6 C), temperature source Oral, resp. rate 14, height 5' 11"  (1.803 m), weight 100.2 kg (221 lb). Maternal Exam:  Abdomen: Fetal presentation: vertex     Fetal Exam Fetal State Assessment: Category I - tracings are normal.     Physical Exam  Nursing note and vitals reviewed. Constitutional: She appears well-developed and well-nourished.  HENT:  Head: Normocephalic.  Eyes: Pupils are equal, round, and reactive to light.  Neck: Normal range of motion.  Cardiovascular: Normal rate and regular rhythm.   Respiratory: Effort normal.  GI: Soft.    Prenatal labs: ABO, Rh: --/--/O POS (10/19 0104) Antibody: NEG (10/19 0104) Rubella:   RPR:    HBsAg:    HIV:    GBS:     Assessment/Plan: IUP at 38 w 6 days Gestational hypertension IOL Pitocin Epidural prn   Jennise Both L 02/06/2017, 7:33 AM

## 2017-02-06 NOTE — Anesthesia Preprocedure Evaluation (Signed)
Anesthesia Evaluation  Patient identified by MRN, date of birth, ID band Patient awake    Reviewed: Allergy & Precautions, H&P , Patient's Chart, lab work & pertinent test results, reviewed documented beta blocker date and time   Airway Mallampati: II  TM Distance: >3 FB Neck ROM: full    Dental no notable dental hx.    Pulmonary    Pulmonary exam normal breath sounds clear to auscultation       Cardiovascular hypertension,  Rhythm:regular Rate:Normal     Neuro/Psych    GI/Hepatic GERD  ,  Endo/Other    Renal/GU      Musculoskeletal   Abdominal   Peds  Hematology   Anesthesia Other Findings   Reproductive/Obstetrics                             Anesthesia Physical Anesthesia Plan  ASA: III  Anesthesia Plan: Epidural   Post-op Pain Management:    Induction:   PONV Risk Score and Plan:   Airway Management Planned:   Additional Equipment:   Intra-op Plan:   Post-operative Plan:   Informed Consent: I have reviewed the patients History and Physical, chart, labs and discussed the procedure including the risks, benefits and alternatives for the proposed anesthesia with the patient or authorized representative who has indicated his/her understanding and acceptance.   Dental Advisory Given  Plan Discussed with: CRNA and Surgeon  Anesthesia Plan Comments: (Labs checked- platelets confirmed with RN in room. Fetal heart tracing, per RN, reported to be stable enough for sitting procedure. Discussed epidural, and patient consents to the procedure:  included risk of possible headache,backache, failed block, allergic reaction, and nerve injury. This patient was asked if she had any questions or concerns before the procedure started.)        Anesthesia Quick Evaluation

## 2017-02-06 NOTE — Anesthesia Pain Management Evaluation Note (Signed)
  CRNA Pain Management Visit Note  Patient: BIRDIE FETTY, 31 y.o., female  "Hello I am a member of the anesthesia team at [facility]. We have an anesthesia team available at all times to provide care throughout the hospital, including epidural management and anesthesia for C-section. I don't know your plan for the delivery whether it a natural birth, water birth, IV sedation, nitrous supplementation, doula or epidural, but we want to meet your pain goals."   1.Was your pain managed to your expectations on prior hospitalizations?   Yes   2.What is your expectation for pain management during this hospitalization?     Epidural  3.How can we help you reach that goal? epidural  Record the patient's initial score and the patient's pain goal.   Pain: 4  Pain Goal: 6 [facility] wants you to be able to say your pain was always managed very well.  Nusaiba Guallpa 02/06/2017

## 2017-02-06 NOTE — Anesthesia Procedure Notes (Signed)
Epidural Patient location during procedure: OB  Staffing Anesthesiologist: Lyndle Herrlich  Preanesthetic Checklist Completed: patient identified, pre-op evaluation, timeout performed, IV checked, risks and benefits discussed and monitors and equipment checked  Epidural Patient position: sitting Prep: DuraPrep Patient monitoring: blood pressure and continuous pulse ox Approach: right paramedian Location: L3-L4 Injection technique: LOR air  Needle:  Needle type: Tuohy  Needle gauge: 17 G Needle insertion depth: 5 cm Catheter type: closed end flexible Catheter size: 19 Gauge Catheter at skin depth: 10 cm Test dose: negative  Assessment Sensory level: T8  Additional Notes    Dosing of Epidural:  1st dose, through catheter ............................................Marland Kitchen  Xylocaine 40 mg  2nd dose, through catheter, after waiting 3 minutes........Marland KitchenXylocaine 60 mg    As each dose occurred, patient was free of IV sx; and patient exhibited no evidence of SA injection.  Patient is more comfortable after epidural dosed. Please see RN's note for documentation of vital signs,and FHR which are stable.  Patient reminded not to try to ambulate with numb legs, and that an RN must be present when she attempts to get up.

## 2017-02-07 ENCOUNTER — Encounter (HOSPITAL_COMMUNITY): Payer: Self-pay

## 2017-02-07 LAB — CBC
HCT: 29.6 % — ABNORMAL LOW (ref 36.0–46.0)
Hemoglobin: 9.6 g/dL — ABNORMAL LOW (ref 12.0–15.0)
MCH: 27 pg (ref 26.0–34.0)
MCHC: 32.4 g/dL (ref 30.0–36.0)
MCV: 83.1 fL (ref 78.0–100.0)
PLATELETS: 213 10*3/uL (ref 150–400)
RBC: 3.56 MIL/uL — ABNORMAL LOW (ref 3.87–5.11)
RDW: 15.2 % (ref 11.5–15.5)
WBC: 17.9 10*3/uL — ABNORMAL HIGH (ref 4.0–10.5)

## 2017-02-07 LAB — RUBELLA SCREEN: Rubella: <0.9 index — ABNORMAL LOW (ref >0.99)

## 2017-02-07 MED ORDER — OXYCODONE-ACETAMINOPHEN 5-325 MG PO TABS
1.0000 | ORAL_TABLET | ORAL | Status: DC | PRN
Start: 2017-02-07 — End: 2017-02-08
  Administered 2017-02-07 – 2017-02-08 (×3): 1 via ORAL
  Filled 2017-02-07 (×3): qty 1

## 2017-02-07 NOTE — Lactation Note (Signed)
This note was copied from a baby's chart. Lactation Consultation Note New mom has breast augmentation, everted short shaft nipples w/stimulation. Rt. Breast larger than LT.   Rt. Nipple than semi flat, needs stimulated to evert well. Taught hand expression. Collected a few drops. Breast massage. Shells given to wear in bra today, hand pump given to pre-pump to evert nipples. Nipples look pink. Coconut oil given to moisturize.  [place] picking baby up to take to mom to BF, baby gagging, has throaty emesis several times. When calms down from that, baby starts rooting.  Placed in football hold. Mom latched baby. D/t short shaft, strongly encouraged wide deep latch, chin tug cheeks to breast. Discussed body alignment, positioning support, breast massage, and sand which hold. Baby latched well w/chin tug helpful. Baby BF well.  Mom shown how to use DEBP & how to disassemble, clean, & reassemble parts. Mom knows to pump q3h for 15-20 min.  Mom encouraged to feed baby 8-12 times/24 hours and with feeding cues. Educated newborn feeding habits, behavior, STS, cluster feeding, I&O, supply and demand. Stress importance of not letting baby BF if hurting nipples d/t probably has shallow latch.  Discussed w/mom LC concerned when breast filling may need NS d/t short shaft. LC allowing time for baby to pull nipple out, as well as shells, pre-pump before latching to soften breast tissue for deeper latch.  Call for assistance if needed.  [place] brochure given w/resources, support groups and [place] services. Patient Name: Madeline Perkins [id number] Date: 02/07/2017 Reason for consult: Initial assessment   Maternal Data Has patient been taught Hand Expression?: Yes Does the patient have breastfeeding experience prior to this delivery?: No  Feeding Feeding Type: Breast Fed Length of feed: 25 min  LATCH Score Latch: Repeated attempts needed to sustain latch, nipple held in mouth throughout feeding, stimulation needed to  elicit sucking reflex.  Audible Swallowing: None  Type of Nipple: Everted at rest and after stimulation (semi flat w/o stimulation)  Comfort (Breast/Nipple): Soft / non-tender  Hold (Positioning): Full assist, staff holds infant at breast  LATCH Score: 5  Interventions Interventions: Breast feeding basics reviewed;Breast compression;Assisted with latch;Hand pump;Adjust position;Skin to skin;Support pillows;DEBP;Breast massage;Position options;Expressed milk  Lactation Tools Discussed/Used Tools: Shells;Pump Shell Type: Inverted Breast pump type: Double-Electric Breast Pump WIC Program: No Pump Review: Setup, frequency, and cleaning;Milk Storage Initiated by:: Allayne Stack RN IBCLC Date initiated:: 02/07/17   Consult Status      Felicitas Sine G 02/07/2017, 5:20 AM

## 2017-02-07 NOTE — Lactation Note (Signed)
This note was copied from a baby's chart. Lactation Consultation Note  Patient Name: Madeline Perkins Date: 02/07/2017 Reason for consult: Follow-up assessment;Breast augmentation Infant is 24 hours old & seen by [facility] for follow-up assessment. Baby was in basinet when [facility] entered. RN stated baby had just nursed on left side before LC came in and that she heard swallows with breast compressions and that her nipple was round after coming off her breast. Baby started cueing again so mom placed baby in football hold at her right breast. Baby latched and suckled but mom reported a little tenderness. Showed mom how to uncurl his lips and apply slight pressure to his chin to help him achieve a deeper latch. Mom reported this helped. Mom did breast compressions throughout feed. After ~10 mins, mom noticed baby's latch felt different so encouraged mom to unlatch baby. Mom's nipples was slightly squished so encouraged mom to support her breast in sandwich hold throughout entire feeding and unlatch him if he has slipped to just her nipple. After a few attempts, baby latched again but was acting very sleepy so would not maintain a latch. Mom switched him to a cradle/ laid-back hold on the same breast and baby latched right away (was going to suggest cross-cradle but baby latched well). Baby's mouth was wider but lips were curled in; after uncurling them, mom reported that it felt more comfortable. Mom continued to give breast compressions throughout feed & swallows were noted. Mom & FOB reported signs to know it is a good or bad latch (they went to the BF class). Mom encouraged to feed baby 8-12 times/24 hours and with feeding cues.  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 Mom reports no questions at this time. Encouraged mom to ask for help as needed.   Maternal Data    Feeding  LATCH Score  Interventions Interventions: Breast feeding basics reviewed;Breast  massage;Support pillows;Assisted with latch;Position options  Lactation Tools Discussed/Used     Consult Status Consult Status: Follow-up Date: 02/08/17 Follow-up type: In-patient    Yvonna Alanis 02/07/2017, 5:57 PM

## 2017-02-07 NOTE — Progress Notes (Signed)
MOB was referred for history of depression/anxiety.  Referral is screened out by Clinical Social Worker because none of the following criteria appear to apply and there are no reports impacting the pregnancy or her transition to the postpartum period.  CSW does not deem it clinically necessary to further investigate at this time.   -History of anxiety/depression during this pregnancy, or of post-partum depression. - Diagnosis of anxiety and/or depression within last 3 years.- (MOB has a hx of situational anxiety not related to pregnancy)  - History of depression due to pregnancy loss/loss of child or -MOB's symptoms are currently being treated with medication and/or therapy.  Please contact the Clinical Social Worker if needs arise or upon MOB request.    Madeline Perkins, MSW, [facility]  Office: [phone]

## 2017-02-07 NOTE — Anesthesia Postprocedure Evaluation (Signed)
Anesthesia Post Note  Patient: Madeline Perkins  Procedure(s) Performed: AN AD HOC LABOR EPIDURAL     Patient location during evaluation: Mother Baby Anesthesia Type: Epidural Level of consciousness: awake Pain management: satisfactory to patient Vital Signs Assessment: post-procedure vital signs reviewed and stable Respiratory status: spontaneous breathing Cardiovascular status: stable Anesthetic complications: no    Last Vitals:  Vitals:   02/07/17 0518 02/07/17 0824  BP: 111/71 115/69  Pulse: 74 72  Resp: 18 18  Temp: 36.7 C 36.7 C  SpO2: 99% 99%    Last Pain:  Vitals:   02/07/17 0824  TempSrc: Oral  PainSc:    Pain Goal: Patients Stated Pain Goal: 3 (02/07/17 0517)               Casimer Lanius

## 2017-02-07 NOTE — Progress Notes (Signed)
Patient doing well. No complaints.  BP 111/71   Pulse 74   Temp 98 F (36.7 C) (Oral)   Resp 18   Ht 5' 11"  (1.803 m)   Wt 100.2 kg (221 lb)   SpO2 99%   Breastfeeding? Unknown   BMI 30.82 kg/m  Results for orders placed or performed during the hospital encounter of 02/06/17 (from the past 24 hour(s))  CBC with Differential/Platelet     Status: Abnormal   Collection Time: 02/06/17  9:27 AM  Result Value Ref Range   WBC 12.8 (H) 4.0 - 10.5 K/uL   RBC 4.07 3.87 - 5.11 MIL/uL   Hemoglobin 11.0 (L) 12.0 - 15.0 g/dL   HCT 34.1 (L) 36.0 - 46.0 %   MCV 83.8 78.0 - 100.0 fL   MCH 27.0 26.0 - 34.0 pg   MCHC 32.3 30.0 - 36.0 g/dL   RDW 15.0 11.5 - 15.5 %   Platelets 228 150 - 400 K/uL   Neutrophils Relative % 76 %   Neutro Abs 9.7 (H) 1.7 - 7.7 K/uL   Lymphocytes Relative 19 %   Lymphs Abs 2.4 0.7 - 4.0 K/uL   Monocytes Relative 4 %   Monocytes Absolute 0.4 0.1 - 1.0 K/uL   Eosinophils Relative 1 %   Eosinophils Absolute 0.1 0.0 - 0.7 K/uL   Basophils Relative 0 %   Basophils Absolute 0.0 0.0 - 0.1 K/uL  CBC with Differential/Platelet     Status: Abnormal   Collection Time: 02/06/17  5:48 PM  Result Value Ref Range   WBC 15.5 (H) 4.0 - 10.5 K/uL   RBC 4.14 3.87 - 5.11 MIL/uL   Hemoglobin 11.2 (L) 12.0 - 15.0 g/dL   HCT 34.7 (L) 36.0 - 46.0 %   MCV 83.8 78.0 - 100.0 fL   MCH 27.1 26.0 - 34.0 pg   MCHC 32.3 30.0 - 36.0 g/dL   RDW 15.1 11.5 - 15.5 %   Platelets 235 150 - 400 K/uL   Neutrophils Relative % 83 %   Neutro Abs 12.8 (H) 1.7 - 7.7 K/uL   Lymphocytes Relative 10 %   Lymphs Abs 1.6 0.7 - 4.0 K/uL   Monocytes Relative 7 %   Monocytes Absolute 1.1 (H) 0.1 - 1.0 K/uL   Eosinophils Relative 0 %   Eosinophils Absolute 0.1 0.0 - 0.7 K/uL   Basophils Relative 0 %   Basophils Absolute 0.0 0.0 - 0.1 K/uL  CBC     Status: Abnormal   Collection Time: 02/07/17  5:18 AM  Result Value Ref Range   WBC 17.9 (H) 4.0 - 10.5 K/uL   RBC 3.56 (L) 3.87 - 5.11 MIL/uL   Hemoglobin  9.6 (L) 12.0 - 15.0 g/dL   HCT 29.6 (L) 36.0 - 46.0 %   MCV 83.1 78.0 - 100.0 fL   MCH 27.0 26.0 - 34.0 pg   MCHC 32.4 30.0 - 36.0 g/dL   RDW 15.2 11.5 - 15.5 %   Platelets 213 150 - 400 K/uL   Abdomen is soft and non tender  PPD # 1  Doing well Routine care circ today - risks reviewed

## 2017-02-08 MED ORDER — IBUPROFEN 600 MG PO TABS: 600.0000 mg | ORAL_TABLET | Freq: Four times a day (QID) | ORAL | 0 refills | Status: DC

## 2017-02-08 NOTE — Discharge Summary (Signed)
Obstetric Discharge Summary Reason for Admission: induction of labor Prenatal Procedures: none Intrapartum Procedures: spontaneous vaginal delivery Postpartum Procedures: none Complications-Operative and Postpartum: 2nd degree perineal laceration Hemoglobin  Date Value Ref Range Status  02/07/2017 9.6 (L) 12.0 - 15.0 g/dL Final   HGB  Date Value Ref Range Status  07/04/2013 11.2 (L) 11.6 - 15.9 g/dL Final   HCT  Date Value Ref Range Status  02/07/2017 29.6 (L) 36.0 - 46.0 % Final  07/04/2013 35.7 34.8 - 46.6 % Final    Physical Exam:  General: alert, cooperative and appears stated age 31: appropriate Uterine Fundus: firm Incision: healing well, no significant drainage, no dehiscence, no significant erythema DVT Evaluation: No evidence of DVT seen on physical exam.  Discharge Diagnoses: Term Pregnancy-delivered  Discharge Information: Date: 02/08/2017 Activity: pelvic rest Diet: routine Medications: Ibuprofen Condition: improved Instructions: refer to practice specific booklet Discharge to: home   Newborn Data: Live born female  Birth Weight: 8 lb 10.6 oz (3929 g) APGAR: 9, 9  Newborn Delivery   Birth date/time:  [birth year]/2017 16:50:00 Delivery type:  Vaginal, Spontaneous Delivery      Home with mother.  Fianna Snowball L 02/08/2017, 6:48 AM

## 2017-02-08 NOTE — Lactation Note (Signed)
This note was copied from a baby's chart. Lactation Consultation Note  Patient Name: Madeline Perkins [id number] Date: 02/08/2017 Reason for consult: Follow-up assessment   P1, Baby 2 hours old.  Hx breast augmentation approx 6 years ago, transaxillary per mother. Mother latched baby in cradle position.  Intermittent swallows observed.   Encouraged compressing breast during feeding.  Mother did flange bottom lip. Mother is using coconut oil for soreness. Encouraged post pumping.  Provided [employer] Employee DEBP.  Referred mother to [email or web address] for breastfeeding information after surgery. Mom encouraged to feed baby 8-12 times/24 hours and with feeding cues.  Reviewed engorgement care and monitoring voids/stools.     Maternal Data    Feeding Feeding Type: Breast Fed  LATCH Score Latch: Grasps breast easily, tongue down, lips flanged, rhythmical sucking.  Audible Swallowing: A few with stimulation  Type of Nipple: Everted at rest and after stimulation  Comfort (Breast/Nipple): Soft / non-tender  Hold (Positioning): No assistance needed to correctly position infant at breast.  LATCH Score: 9  Interventions Interventions: Breast feeding basics reviewed  Lactation Tools Discussed/Used     Consult Status Consult Status: Complete    Carlye Grippe 02/08/2017, 9:46 AM

## 2017-02-14 ENCOUNTER — Inpatient Hospital Stay (HOSPITAL_COMMUNITY): Admission: AD | Admit: 2017-02-14 | Payer: 59 | Source: Ambulatory Visit | Admitting: Obstetrics and Gynecology

## 2017-03-17 DIAGNOSIS — Z1389 Encounter for screening for other disorder: Secondary | ICD-10-CM | POA: Diagnosis not present

## 2017-04-09 DIAGNOSIS — Z30430 Encounter for insertion of intrauterine contraceptive device: Secondary | ICD-10-CM | POA: Diagnosis not present

## 2017-04-09 DIAGNOSIS — Z3202 Encounter for pregnancy test, result negative: Secondary | ICD-10-CM | POA: Diagnosis not present

## 2017-05-07 DIAGNOSIS — Y92538 Other ambulatory health services establishments as the place of occurrence of the external cause: Secondary | ICD-10-CM | POA: Diagnosis not present

## 2017-05-07 DIAGNOSIS — C4359 Malignant melanoma of other part of trunk: Secondary | ICD-10-CM | POA: Diagnosis not present

## 2018-09-01 DIAGNOSIS — Z0000 Encounter for general adult medical examination without abnormal findings: Secondary | ICD-10-CM | POA: Diagnosis not present

## 2018-09-06 DIAGNOSIS — F411 Generalized anxiety disorder: Secondary | ICD-10-CM | POA: Diagnosis not present

## 2018-09-06 DIAGNOSIS — Z0000 Encounter for general adult medical examination without abnormal findings: Secondary | ICD-10-CM | POA: Diagnosis not present

## 2020-04-21 NOTE — L&D Delivery Note (Addendum)
Delivery Note At 9:12 PM a viable female was delivered via Vaginal, Spontaneous (Presentation: Right Occiput Anterior).  APGAR: 9, 9; weight  pending.  Placenta status: Spontaneous;Pathology, Intact.  Cord: 3 vessels with the following complications: None.  Cord pH: n/a  Anesthesia: Epidural Episiotomy: None Lacerations: 2nd degree Suture Repair: 3.0 vicryl rapide Est. Blood Loss (mL): 400  Mom to postpartum.  Baby to Couplet care / Skin to Skin.  Linda Hedges 04/18/2021, 9:36 PM

## 2020-07-06 ENCOUNTER — Other Ambulatory Visit: Payer: Self-pay | Admitting: Family

## 2020-07-06 DIAGNOSIS — D72829 Elevated white blood cell count, unspecified: Secondary | ICD-10-CM

## 2020-07-09 ENCOUNTER — Encounter: Payer: Self-pay | Admitting: Family

## 2020-07-09 ENCOUNTER — Inpatient Hospital Stay: Payer: No Typology Code available for payment source | Attending: Family

## 2020-07-09 ENCOUNTER — Inpatient Hospital Stay (HOSPITAL_BASED_OUTPATIENT_CLINIC_OR_DEPARTMENT_OTHER): Payer: No Typology Code available for payment source | Admitting: Family

## 2020-07-09 ENCOUNTER — Other Ambulatory Visit: Payer: Self-pay

## 2020-07-09 VITALS — BP 114/89 | HR 70 | Temp 98.7°F | Resp 18 | Ht 70.0 in | Wt 196.8 lb

## 2020-07-09 DIAGNOSIS — Z8719 Personal history of other diseases of the digestive system: Secondary | ICD-10-CM | POA: Diagnosis not present

## 2020-07-09 DIAGNOSIS — Z85820 Personal history of malignant melanoma of skin: Secondary | ICD-10-CM | POA: Insufficient documentation

## 2020-07-09 DIAGNOSIS — Z800 Family history of malignant neoplasm of digestive organs: Secondary | ICD-10-CM | POA: Insufficient documentation

## 2020-07-09 DIAGNOSIS — Z8249 Family history of ischemic heart disease and other diseases of the circulatory system: Secondary | ICD-10-CM | POA: Diagnosis not present

## 2020-07-09 DIAGNOSIS — D72829 Elevated white blood cell count, unspecified: Secondary | ICD-10-CM

## 2020-07-09 DIAGNOSIS — K529 Noninfective gastroenteritis and colitis, unspecified: Secondary | ICD-10-CM | POA: Insufficient documentation

## 2020-07-09 DIAGNOSIS — Z807 Family history of other malignant neoplasms of lymphoid, hematopoietic and related tissues: Secondary | ICD-10-CM | POA: Diagnosis not present

## 2020-07-09 DIAGNOSIS — Z833 Family history of diabetes mellitus: Secondary | ICD-10-CM

## 2020-07-09 DIAGNOSIS — C4359 Malignant melanoma of other part of trunk: Secondary | ICD-10-CM

## 2020-07-09 LAB — CBC WITH DIFFERENTIAL (CANCER CENTER ONLY)
Abs Immature Granulocytes: 0.04 10*3/uL (ref 0.00–0.07)
Basophils Absolute: 0.1 10*3/uL (ref 0.0–0.1)
Basophils Relative: 1 %
Eosinophils Absolute: 0.4 10*3/uL (ref 0.0–0.5)
Eosinophils Relative: 4 %
HCT: 39.5 % (ref 36.0–46.0)
Hemoglobin: 12.8 g/dL (ref 12.0–15.0)
Immature Granulocytes: 0 %
Lymphocytes Relative: 26 %
Lymphs Abs: 2.5 10*3/uL (ref 0.7–4.0)
MCH: 26.9 pg (ref 26.0–34.0)
MCHC: 32.4 g/dL (ref 30.0–36.0)
MCV: 83 fL (ref 80.0–100.0)
Monocytes Absolute: 0.9 10*3/uL (ref 0.1–1.0)
Monocytes Relative: 9 %
Neutro Abs: 5.9 10*3/uL (ref 1.7–7.7)
Neutrophils Relative %: 60 %
Platelet Count: 329 10*3/uL (ref 150–400)
RBC: 4.76 MIL/uL (ref 3.87–5.11)
RDW: 13.2 % (ref 11.5–15.5)
WBC Count: 9.8 10*3/uL (ref 4.0–10.5)
nRBC: 0 % (ref 0.0–0.2)

## 2020-07-09 LAB — CMP (CANCER CENTER ONLY)
ALT: 11 U/L (ref 0–44)
AST: 12 U/L — ABNORMAL LOW (ref 15–41)
Albumin: 4.6 g/dL (ref 3.5–5.0)
Alkaline Phosphatase: 63 U/L (ref 38–126)
Anion gap: 8 (ref 5–15)
BUN: 13 mg/dL (ref 6–20)
CO2: 26 mmol/L (ref 22–32)
Calcium: 9.9 mg/dL (ref 8.9–10.3)
Chloride: 103 mmol/L (ref 98–111)
Creatinine: 0.95 mg/dL (ref 0.44–1.00)
GFR, Estimated: >60 mL/min (ref >60)
Glucose, Bld: 87 mg/dL (ref 70–99)
Potassium: 4.2 mmol/L (ref 3.5–5.1)
Sodium: 137 mmol/L (ref 135–145)
Total Bilirubin: 0.4 mg/dL (ref 0.3–1.2)
Total Protein: 7.2 g/dL (ref 6.5–8.1)

## 2020-07-09 LAB — LACTATE DEHYDROGENASE: LDH: 141 U/L (ref 98–192)

## 2020-07-09 LAB — SAVE SMEAR(SSMR), FOR PROVIDER SLIDE REVIEW

## 2020-07-09 NOTE — Progress Notes (Signed)
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 Madeline Perkins is a very pleasant 35 yo caucasian female with intermittent elevated WBC count for at least the last 8 years.  She was seen previously by Dr. 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 She had been alternating appointments with our office and her dermatologist Dr. Allyson Sabal throughout the year. She now sees D. Ouida Sills, PA-C for derm follow-ups. She states that she recently had a benign nevus removed from her chest.  Unfortunately her mother and husband passed away within a couple months of each other this past fall (2021). This has been very hard for her. She is now living with her father and sweet 48 year old son.  She has history of colitis and was previously treated with Lialda. She states that this worked well in past. She has noted that flares occur with stress. She occasionally noted bright red blood in her stool with straining. She has history of hemorrhoid since pregnancy. New patient appointment with GI Dr. Watt Climes is scheduled for tomorrow.  Family history of cancer includes: mother - lymphoma and metastatic liver cancer, maternal great grandmother - lymphoma.  She had her IUD removed recently and had her first cycle last month. She states that this was regular with normal flow.  No other blood loss noted. No abnormal bruising, no petechiae.  No fever, chills, n/v, cough, rash,  dizziness, SOB, chest pain, palpitations, abdominal pain or changes in  bladder habits.  No swelling, tenderness, numbness or tingling in her extremities.  No falls or syncope to report.  She is active and enjoys regular exercise.  She has maintained a good appetite and is staying well hydrated. Her weight is stable at 196 lbs. No smoking, ETOH or recreational drug use.  She stays busy with her sweet family and works for [employer].   ROS: All other 10 point review of systems is negative.   PAST MEDICAL HISTORY:   Past Medical History:  Diagnosis Date  . Cancer ([place])    melanoma Stage 4 on loser back  . GERD (gastroesophageal reflux disease)   . Intermittent palpitations    w/u by cardio neg (Dr Stanford Breed), Toprol prn  . Pregnancy induced hypertension   . Situational anxiety     ALLERGIES: Allergies  Allergen Reactions  . Penicillins Hives    Childhood allergy Has patient had a PCN reaction causing immediate rash, facial/tongue/throat swelling, SOB or lightheadedness with hypotension: Yes Has patient had a PCN reaction causing severe rash involving mucus membranes or skin necrosis: No Has patient had a PCN reaction that required hospitalization: No Has patient had a PCN reaction occurring within the last 10 years: No If all of the above answers are "NO", then may proceed with Cephalosporin use.       MEDICATIONS:  Current Outpatient Medications on File Prior to Visit  Medication Sig Dispense Refill  .  ibuprofen (ADVIL,MOTRIN) 600 MG tablet Take 1 tablet (600 mg total) by mouth every 6 (six) hours. 30 tablet 0  . Prenatal Vit-Fe Fumarate-FA (PRENATAL MULTIVITAMIN) TABS tablet Take 1 tablet by mouth daily at 12 noon.    . ranitidine (ZANTAC) 150 MG tablet Take 150 mg by mouth daily as needed for heartburn.     No current facility-administered medications on file prior to visit.     PAST SURGICAL HISTORY Past Surgical History:  Procedure Laterality Date  .  APPENDECTOMY    . BREAST ENHANCEMENT SURGERY    . BREAST SURGERY    . LAPAROSCOPIC APPENDECTOMY  05/03/2012   Procedure: APPENDECTOMY LAPAROSCOPIC;  Surgeon: Gwenyth Ober, MD;  Location: [facility];  Service: General;  Laterality: N/A;  . MELANOMA EXCISION WITH SENTINEL LYMPH NODE BIOPSY N/A 08/10/2012   Procedure: RE-EXCISION BACK MELANOMA WITH SENTINEL LEFT INGUINAL LYMPH NODE BIOPSY;  Surgeon: Gwenyth Ober, MD;  Location: [facility];  Service: General;  Laterality: N/A;  . WISDOM TOOTH EXTRACTION  Nov 2009    FAMILY HISTORY: Family History  Problem Relation Age of Onset  . Other Mother   . Cancer Mother        lymphoma  . Hypertension Mother   . Hypertension Father   . Cancer Paternal Grandfather        esophageal  . Diabetes Maternal Grandmother     SOCIAL HISTORY:  reports that she has never smoked. She has never used smokeless tobacco. She reports current alcohol use. She reports that she does not use drugs.  PERFORMANCE STATUS: The patient's performance status is 1 - Symptomatic but completely ambulatory  PHYSICAL EXAM: Most Recent Vital Signs: unknown if currently breastfeeding. BP 114/89 (BP Location: Left Arm, Patient Position: Sitting)   Pulse 70   Temp 98.7 F (37.1 C) (Oral)   Resp 18   Ht 5' 10"  (1.778 m)   Wt 196 lb 12.8 oz (89.3 kg)   SpO2 100%   BMI 28.24 kg/m   General Appearance:    Alert, cooperative, no distress, appears stated age  Head:    Normocephalic, without obvious abnormality, atraumatic  Eyes:    PERRL, conjunctiva/corneas clear, EOM's intact, fundi    benign, both eyes        Throat:   Lips, mucosa, and tongue normal; teeth and gums normal  Neck:   Supple, symmetrical, trachea midline, no adenopathy;    thyroid:  no enlargement/tenderness/nodules; no carotid   bruit or JVD  Back:     Symmetric, no curvature, ROM normal, no CVA tenderness  Lungs:     Clear to auscultation bilaterally, respirations unlabored  Chest Wall:    No tenderness or  deformity   Heart:    Regular rate and rhythm, S1 and S2 normal, no murmur, rub   or gallop     Abdomen:     Soft, non-tender, bowel sounds active all four quadrants,    no masses, no organomegaly        Extremities:   Extremities normal, atraumatic, no cyanosis or edema  Pulses:   2+ and symmetric all extremities  Skin:   Skin color, texture, turgor normal, no rashes or lesions  Lymph nodes:   Cervical, supraclavicular, and axillary nodes normal  Neurologic:   CNII-XII intact, normal strength, sensation and reflexes    throughout    LABORATORY DATA:  Results for orders placed or performed in visit on 07/09/20 (from the past 48 hour(s))  CBC with Differential (Cancer  Center Only)     Status: None   Collection Time: 07/09/20  2:49 PM  Result Value Ref Range   WBC Count 9.8 4.0 - 10.5 K/uL   RBC 4.76 3.87 - 5.11 MIL/uL   Hemoglobin 12.8 12.0 - 15.0 g/dL   HCT 39.5 36.0 - 46.0 %   MCV 83.0 80.0 - 100.0 fL   MCH 26.9 26.0 - 34.0 pg   MCHC 32.4 30.0 - 36.0 g/dL   RDW 13.2 11.5 - 15.5 %   Platelet Count 329 150 - 400 K/uL   nRBC 0.0 0.0 - 0.2 %   Neutrophils Relative % 60 %   Neutro Abs 5.9 1.7 - 7.7 K/uL   Lymphocytes Relative 26 %   Lymphs Abs 2.5 0.7 - 4.0 K/uL   Monocytes Relative 9 %   Monocytes Absolute 0.9 0.1 - 1.0 K/uL   Eosinophils Relative 4 %   Eosinophils Absolute 0.4 0.0 - 0.5 K/uL   Basophils Relative 1 %   Basophils Absolute 0.1 0.0 - 0.1 K/uL   Immature Granulocytes 0 %   Abs Immature Granulocytes 0.04 0.00 - 0.07 K/uL    Comment: Performed at [facility] at [facility], [address] [place], [place] [address]  CMP ([place] [facility] only)     Status: Abnormal   Collection Time: 07/09/20  2:49 PM  Result Value Ref Range   Sodium 137 135 - 145 mmol/L   Potassium 4.2 3.5 - 5.1 mmol/L   Chloride 103 98 - 111 mmol/L   CO2 26 22 - 32 mmol/L   Glucose, Bld 87 70 - 99 mg/dL    Comment: Glucose reference range applies only to samples  taken after fasting for at least 8 hours.   BUN 13 6 - 20 mg/dL   Creatinine 0.95 0.44 - 1.00 mg/dL   Calcium 9.9 8.9 - 10.3 mg/dL   Total Protein 7.2 6.5 - 8.1 g/dL   Albumin 4.6 3.5 - 5.0 g/dL   AST 12 (L) 15 - 41 U/L   ALT 11 0 - 44 U/L   Alkaline Phosphatase 63 38 - 126 U/L   Total Bilirubin 0.4 0.3 - 1.2 mg/dL   GFR, Estimated >60 >60 mL/min    Comment: (NOTE) Calculated using the CKD-EPI Creatinine Equation (2021)    Anion gap 8 5 - 15    Comment: Performed at [facility] at [facility], [address], [place], [place] [address]  Save [facility])     Status: None   Collection Time: 07/09/20  2:49 PM  Result Value Ref Range   Smear Review SMEAR STAINED AND AVAILABLE FOR REVIEW     Comment: Performed at [facility] at [facility], [address] [place], [place] [address]      RADIOGRAPHY: No results found.     PATHOLOGY: None  ASSESSMENT/PLAN: Ms. Madeline Perkins is a very pleasant 35 yo caucasian female with intermittent elevated WBC count for at least the last 8 years felt to be reactive secondary to colitis.  Dr. Marin Olp was able to to review lab work as well as patient's blood smear. No immature cells noted, no abnormality or evidence of malignancy noted.  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  We will follow-up as needed.   All questions were answered and she is  in agreement with the plan. She can contact our office with any questions or concerns. We can certainly see again in the future if needed.   The patient was discussed with Dr. Marin Olp and he is in agreement with the aforementioned.   Laverna Peace, NP

## 2020-07-10 ENCOUNTER — Telehealth: Payer: Self-pay | Admitting: *Deleted

## 2020-07-10 NOTE — Telephone Encounter (Signed)
Per los 07/09/20 - Patient will follow up as needed.

## 2020-08-10 ENCOUNTER — Other Ambulatory Visit (HOSPITAL_COMMUNITY): Payer: Self-pay

## 2020-08-10 MED ORDER — PANTOPRAZOLE SODIUM 40 MG PO TBEC
40.0000 mg | DELAYED_RELEASE_TABLET | Freq: Every day | ORAL | 4 refills | Status: DC
  Filled 2020-08-10: qty 30, 30d supply, fill #0
  Filled 2020-09-10: qty 30, 30d supply, fill #1

## 2020-08-10 MED ORDER — MESALAMINE 1.2 G PO TBEC
4.8000 g | DELAYED_RELEASE_TABLET | Freq: Every day | ORAL | 5 refills | Status: DC
  Filled 2020-08-10: qty 120, 30d supply, fill #0
  Filled 2020-09-10: qty 120, 30d supply, fill #1
  Filled 2020-12-26 – 2021-01-04 (×2): qty 120, 30d supply, fill #2
  Filled 2021-02-26: qty 120, 30d supply, fill #3
  Filled 2021-05-09: qty 120, 30d supply, fill #4

## 2020-09-10 ENCOUNTER — Other Ambulatory Visit (HOSPITAL_COMMUNITY): Payer: Self-pay

## 2020-09-13 LAB — OB RESULTS CONSOLE RPR: RPR: NONREACTIVE

## 2020-09-13 LAB — OB RESULTS CONSOLE GC/CHLAMYDIA
Chlamydia: NEGATIVE
Gonorrhea: NEGATIVE

## 2020-09-13 LAB — OB RESULTS CONSOLE ABO/RH: RH Type: POSITIVE

## 2020-09-13 LAB — OB RESULTS CONSOLE HIV ANTIBODY (ROUTINE TESTING): HIV: NONREACTIVE

## 2020-09-13 LAB — HEPATITIS C ANTIBODY: HCV Ab: NEGATIVE

## 2020-09-13 LAB — OB RESULTS CONSOLE ANTIBODY SCREEN: Antibody Screen: NEGATIVE

## 2020-09-13 LAB — OB RESULTS CONSOLE HEPATITIS B SURFACE ANTIGEN: Hepatitis B Surface Ag: NEGATIVE

## 2020-09-13 LAB — OB RESULTS CONSOLE RUBELLA ANTIBODY, IGM: Rubella: IMMUNE

## 2020-10-29 ENCOUNTER — Other Ambulatory Visit: Payer: Self-pay | Admitting: Obstetrics and Gynecology

## 2020-10-29 DIAGNOSIS — Z363 Encounter for antenatal screening for malformations: Secondary | ICD-10-CM

## 2020-10-31 ENCOUNTER — Other Ambulatory Visit: Payer: Self-pay

## 2020-11-26 ENCOUNTER — Encounter: Payer: Self-pay | Admitting: *Deleted

## 2020-11-28 ENCOUNTER — Ambulatory Visit: Payer: No Typology Code available for payment source | Attending: Obstetrics and Gynecology

## 2020-11-28 ENCOUNTER — Ambulatory Visit: Payer: No Typology Code available for payment source | Admitting: *Deleted

## 2020-11-28 ENCOUNTER — Encounter: Payer: Self-pay | Admitting: *Deleted

## 2020-11-28 ENCOUNTER — Other Ambulatory Visit: Payer: Self-pay

## 2020-11-28 ENCOUNTER — Ambulatory Visit (HOSPITAL_BASED_OUTPATIENT_CLINIC_OR_DEPARTMENT_OTHER): Payer: No Typology Code available for payment source | Admitting: Obstetrics and Gynecology

## 2020-11-28 ENCOUNTER — Other Ambulatory Visit: Payer: Self-pay | Admitting: *Deleted

## 2020-11-28 VITALS — BP 104/62 | HR 79

## 2020-11-28 DIAGNOSIS — O321XX0 Maternal care for breech presentation, not applicable or unspecified: Secondary | ICD-10-CM

## 2020-11-28 DIAGNOSIS — Z85820 Personal history of malignant melanoma of skin: Secondary | ICD-10-CM | POA: Diagnosis not present

## 2020-11-28 DIAGNOSIS — Z3A19 19 weeks gestation of pregnancy: Secondary | ICD-10-CM | POA: Insufficient documentation

## 2020-11-28 DIAGNOSIS — O358XX0 Maternal care for other (suspected) fetal abnormality and damage, not applicable or unspecified: Secondary | ICD-10-CM

## 2020-11-28 DIAGNOSIS — Z363 Encounter for antenatal screening for malformations: Secondary | ICD-10-CM | POA: Insufficient documentation

## 2020-11-28 DIAGNOSIS — O35EXX0 Maternal care for other (suspected) fetal abnormality and damage, fetal genitourinary anomalies, not applicable or unspecified: Secondary | ICD-10-CM

## 2020-11-28 DIAGNOSIS — O283 Abnormal ultrasonic finding on antenatal screening of mother: Secondary | ICD-10-CM | POA: Diagnosis not present

## 2020-11-28 DIAGNOSIS — O350XX0 Maternal care for (suspected) central nervous system malformation in fetus, not applicable or unspecified: Secondary | ICD-10-CM | POA: Diagnosis present

## 2020-11-28 DIAGNOSIS — O3503X0 Maternal care for (suspected) central nervous system malformation or damage in fetus, choroid plexus cysts, not applicable or unspecified: Secondary | ICD-10-CM

## 2020-11-28 NOTE — Progress Notes (Signed)
Maternal-Fetal Medicine   Name: Shravya Wickwire DOB: [birth year] MRN: [id number] Referring Provider: Everlene Farrier, MD  I had the pleasure of seeing [name] today at the [facility]. She is G3 P1 at 19-weeks' gestation and is here for fetal anatomy scan.  Prenatal: Advanced maternal age.  On cell free fetal DNA screening, the risks of fetal aneuploidies are not increased.  She screened negative for cystic fibrosis and spinal muscular atrophy. Obstetric history significant for a term vaginal delivery of a female infant weighing 8 pounds 10 ounces at birth.  She had gestational hypertension and a previous pregnancy.  Past medical history significant for melanoma and patient had complete excision with no recurrence. Past surgical history: Appendectomy, breast implants (cosmetic).  Ultrasound We performed fetal anatomical survey.  Significant findings include: -Right choroid plexus cyst. -Bilateral urinary tract dilations.  Kidneys, otherwise, appear normal with no increased echogenicities. Fetal biometry is 9 days ahead of established gestational age.  Amniotic fluid is normal good fetal activity seen.  No other markers of aneuploidies or fetal structural defects are seen.  I counseled the couple on the following Urinary tract dilations Given that she had low risk for fetal Down syndrome, UTD should be considered a normal variant and not a marker for Down syndrome.  Most UTDs resolved with advancing gestation or after birth and are only rarely associated with obstructive uropathy.  Patient will require serial ultrasound for renal assessments.  I reassured the patient that normal amniotic fluid confirms preserved renal function.  Early delivery is not indicated provided amniotic fluid remains within normal range.  Choroid plexus cyst (CPC) I counseled the patient that isolated CPC is only rarely associated with chromosomal anomaly (trisomy 18). I also reassured her that [facility] is  not associated with structural malformations in the brain. CPCs usually resolve with advancing gestation.  Given that she had low risk for trisomy 18 on cell free fetal DNA screening, this should be considered a normal variant. I informed that only amniocentesis will give a definitive result on the fetal karyotype.  I explained the procedure and possible complication of miscarriage (1 and 500 procedures).  Patient opted not to have amniocentesis.  Recommendations -An appointment was made for her to return in 4 weeks for fetal growth assessment and evaluation of the kidneys.  Thank you for consultation.  If you have any questions or concerns, please contact me the [facility].  Consultation including face-to-face (more than 50%) counseling 30 minutes.

## 2020-12-26 ENCOUNTER — Other Ambulatory Visit (HOSPITAL_COMMUNITY): Payer: Self-pay

## 2020-12-27 ENCOUNTER — Other Ambulatory Visit: Payer: Self-pay | Admitting: *Deleted

## 2020-12-27 ENCOUNTER — Ambulatory Visit: Payer: No Typology Code available for payment source | Admitting: *Deleted

## 2020-12-27 ENCOUNTER — Encounter: Payer: Self-pay | Admitting: *Deleted

## 2020-12-27 ENCOUNTER — Ambulatory Visit: Payer: No Typology Code available for payment source | Attending: Obstetrics and Gynecology

## 2020-12-27 ENCOUNTER — Other Ambulatory Visit: Payer: Self-pay

## 2020-12-27 VITALS — BP 106/63 | HR 73

## 2020-12-27 DIAGNOSIS — O35EXX0 Maternal care for other (suspected) fetal abnormality and damage, fetal genitourinary anomalies, not applicable or unspecified: Secondary | ICD-10-CM

## 2020-12-27 DIAGNOSIS — O358XX0 Maternal care for other (suspected) fetal abnormality and damage, not applicable or unspecified: Secondary | ICD-10-CM | POA: Diagnosis not present

## 2020-12-27 DIAGNOSIS — Z3689 Encounter for other specified antenatal screening: Secondary | ICD-10-CM

## 2020-12-27 DIAGNOSIS — Z3A23 23 weeks gestation of pregnancy: Secondary | ICD-10-CM

## 2021-01-03 ENCOUNTER — Other Ambulatory Visit (HOSPITAL_COMMUNITY): Payer: Self-pay

## 2021-01-04 ENCOUNTER — Other Ambulatory Visit (HOSPITAL_COMMUNITY): Payer: Self-pay

## 2021-02-14 ENCOUNTER — Other Ambulatory Visit: Payer: Self-pay | Admitting: *Deleted

## 2021-02-14 ENCOUNTER — Ambulatory Visit: Payer: No Typology Code available for payment source | Admitting: *Deleted

## 2021-02-14 ENCOUNTER — Encounter: Payer: Self-pay | Admitting: *Deleted

## 2021-02-14 ENCOUNTER — Other Ambulatory Visit: Payer: Self-pay

## 2021-02-14 ENCOUNTER — Ambulatory Visit: Payer: No Typology Code available for payment source | Attending: Maternal & Fetal Medicine

## 2021-02-14 VITALS — BP 109/65 | HR 89

## 2021-02-14 DIAGNOSIS — Z3A30 30 weeks gestation of pregnancy: Secondary | ICD-10-CM

## 2021-02-14 DIAGNOSIS — O35EXX0 Maternal care for other (suspected) fetal abnormality and damage, fetal genitourinary anomalies, not applicable or unspecified: Secondary | ICD-10-CM | POA: Insufficient documentation

## 2021-02-14 DIAGNOSIS — Z362 Encounter for other antenatal screening follow-up: Secondary | ICD-10-CM

## 2021-02-14 DIAGNOSIS — Z3689 Encounter for other specified antenatal screening: Secondary | ICD-10-CM | POA: Insufficient documentation

## 2021-02-26 ENCOUNTER — Other Ambulatory Visit (HOSPITAL_COMMUNITY): Payer: Self-pay

## 2021-03-18 ENCOUNTER — Telehealth: Payer: Self-pay

## 2021-03-18 NOTE — Telephone Encounter (Signed)
mar/lm for patient (to advise of appt 12/1 arrive by 830).

## 2021-03-21 ENCOUNTER — Encounter: Payer: Self-pay | Admitting: *Deleted

## 2021-03-21 ENCOUNTER — Ambulatory Visit: Payer: No Typology Code available for payment source

## 2021-03-21 ENCOUNTER — Other Ambulatory Visit: Payer: Self-pay

## 2021-03-21 ENCOUNTER — Ambulatory Visit: Payer: No Typology Code available for payment source | Admitting: *Deleted

## 2021-03-21 ENCOUNTER — Ambulatory Visit: Payer: No Typology Code available for payment source | Attending: Obstetrics and Gynecology

## 2021-03-21 VITALS — BP 115/68 | HR 83

## 2021-03-21 DIAGNOSIS — Z3A35 35 weeks gestation of pregnancy: Secondary | ICD-10-CM | POA: Diagnosis not present

## 2021-03-21 DIAGNOSIS — O283 Abnormal ultrasonic finding on antenatal screening of mother: Secondary | ICD-10-CM | POA: Insufficient documentation

## 2021-03-21 DIAGNOSIS — O35EXX0 Maternal care for other (suspected) fetal abnormality and damage, fetal genitourinary anomalies, not applicable or unspecified: Secondary | ICD-10-CM | POA: Diagnosis present

## 2021-03-21 DIAGNOSIS — O403XX0 Polyhydramnios, third trimester, not applicable or unspecified: Secondary | ICD-10-CM | POA: Diagnosis not present

## 2021-03-28 LAB — OB RESULTS CONSOLE GBS: GBS: NEGATIVE

## 2021-04-08 ENCOUNTER — Inpatient Hospital Stay (HOSPITAL_COMMUNITY)
Admission: AD | Admit: 2021-04-08 | Discharge: 2021-04-08 | Disposition: A | Discharge status: Home / Self Care | Payer: No Typology Code available for payment source | Attending: Obstetrics and Gynecology | Admitting: Obstetrics and Gynecology

## 2021-04-08 ENCOUNTER — Encounter (HOSPITAL_COMMUNITY): Payer: Self-pay | Admitting: Obstetrics and Gynecology

## 2021-04-08 ENCOUNTER — Other Ambulatory Visit: Payer: Self-pay

## 2021-04-08 DIAGNOSIS — Z0371 Encounter for suspected problem with amniotic cavity and membrane ruled out: Secondary | ICD-10-CM

## 2021-04-08 DIAGNOSIS — Z3A37 37 weeks gestation of pregnancy: Secondary | ICD-10-CM | POA: Diagnosis not present

## 2021-04-08 DIAGNOSIS — O26893 Other specified pregnancy related conditions, third trimester: Secondary | ICD-10-CM | POA: Diagnosis present

## 2021-04-08 DIAGNOSIS — R102 Pelvic and perineal pain: Secondary | ICD-10-CM | POA: Diagnosis not present

## 2021-04-08 LAB — WET PREP, GENITAL
Clue Cells Wet Prep HPF POC: NONE SEEN
Sperm: NONE SEEN
Trich, Wet Prep: NONE SEEN
WBC, Wet Prep HPF POC: >=10 — AB (ref <10)
Yeast Wet Prep HPF POC: NONE SEEN

## 2021-04-08 LAB — POCT FERN TEST: POCT Fern Test: NEGATIVE

## 2021-04-08 NOTE — MAU Note (Signed)
Patient Discharged home.  Instructions provided regarding labor precautions, LOF, Bleeding and Fetal Movement and verbalized understanding.  Patient has a Prenatal appointment with provider on Wednesday.

## 2021-04-08 NOTE — MAU Provider Note (Signed)
Event Date/Time   First Provider Initiated Contact with Patient 04/08/21 1410      Chief Complaint:  Pelvic Pain and Rupture of Membranes   Madeline Perkins is  35 y.o. G3P1011 at [redacted]w[redacted]d presents complaining of Pelvic Pain and Rupture of Membranes Has had some mild ctx last night, today w/pelvic pressure and some lower pelvic pain.  Has had several instances where her underware became damp.  Had IC last night.  Denies bleeding, abnormal vaginal discharge/itching/burning.   Obstetrical/Gynecological History: OB History     Gravida  3   Para  1   Term  1   Preterm      AB  1   Living  1      SAB  1   IAB      Ectopic      Multiple  0   Live Births  1          Past Medical History: Past Medical History:  Diagnosis Date   Cancer ([place])    melanoma Stage 4 on loser back   GERD (gastroesophageal reflux disease)    Intermittent palpitations    w/u by cardio neg (Dr Stanford Breed), Toprol prn   Pregnancy induced hypertension    Situational anxiety    Ulcerative colitis ([place])     Past Surgical History: Past Surgical History:  Procedure Laterality Date   APPENDECTOMY     BREAST ENHANCEMENT SURGERY     BREAST SURGERY     LAPAROSCOPIC APPENDECTOMY  05/03/2012   Procedure: APPENDECTOMY LAPAROSCOPIC;  Surgeon: Gwenyth Ober, MD;  Location: [facility];  Service: General;  Laterality: N/A;   MELANOMA EXCISION WITH SENTINEL LYMPH NODE BIOPSY N/A 08/10/2012   Procedure: RE-EXCISION BACK MELANOMA WITH SENTINEL LEFT INGUINAL LYMPH NODE BIOPSY;  Surgeon: Gwenyth Ober, MD;  Location: [facility];  Service: General;  Laterality: N/A;   WISDOM TOOTH EXTRACTION  Nov 2009    Family History: Family History  Problem Relation Age of Onset   Other Mother    Cancer Mother        lymphoma   Hypertension Mother    Hypertension Father    Cancer Paternal Grandfather        esophageal   Diabetes Maternal Grandmother     Social History: Social History   Tobacco Use   Smoking status: Never    Smokeless tobacco: Never  Vaping Use   Vaping Use: Never used  Substance Use Topics   Alcohol use: Not Currently    Comment: not while preg   Drug use: No    Allergies:  Allergies  Allergen Reactions   Penicillins Hives    Childhood allergy Has patient had a PCN reaction causing immediate rash, facial/tongue/throat swelling, SOB or lightheadedness with hypotension: Yes Has patient had a PCN reaction causing severe rash involving mucus membranes or skin necrosis: No Has patient had a PCN reaction that required hospitalization: No Has patient had a PCN reaction occurring within the last 10 years: No If all of the above answers are "NO", then may proceed with Cephalosporin use.     Meds:  Medications Prior to Admission  Medication Sig Dispense Refill Last Dose   mesalamine (LIALDA) 1.2 g EC tablet Take 4 tablets (4.8 g total) by mouth daily. 120 tablet 5 04/08/2021   omeprazole (PRILOSEC) 20 MG capsule Take 20 mg by mouth 2 (two) times daily before a meal.   04/08/2021   Prenatal Vit-Fe Fumarate-FA (PRENATAL MULTIVITAMIN) TABS tablet Take 1  tablet by mouth daily at 12 noon.       Review of Systems   Constitutional: Negative for fever and chills Eyes: Negative for visual disturbances Respiratory: Negative for shortness of breath, dyspnea Cardiovascular: Negative for chest pain or palpitations  Gastrointestinal: Negative for vomiting, diarrhea and constipation Genitourinary: Negative for dysuria and urgency Musculoskeletal: Negative for back pain, joint pain, myalgias.  Normal ROM  Neurological: Negative for dizziness and headaches    Physical Exam  Blood pressure 108/74, pulse 83, temperature 98 F (36.7 C), temperature source Oral, resp. rate 16, height 5\' 10"  (1.778 m), weight 100.1 kg, last menstrual period 07/18/2020, SpO2 96 %, unknown if currently breastfeeding. GENERAL: Well-developed, well-nourished female in no acute distress.  LUNGS: Normal respiratory  effort HEART: Regular rate and rhythm. ABDOMEN: Soft, nontender, nondistended, gravid.  EXTREMITIES: Nontender, no edema, 2+ distal pulses. DTR's 2+ PELVIC:  SSE;  normal appearing discharge w/o odor. No pooling/negative fern and valsalva.  Cx 1.5/20/-3  Presentation: cephalic FHT:  Baseline rate 140 bpm   Variability moderate  Accelerations present   Decelerations none Contractions: rare   Labs: Results for orders placed or performed during the hospital encounter of 04/08/21 (from the past 24 hour(s))  Wet prep, genital   Collection Time: 04/08/21  2:15 PM  Result Value Ref Range   Yeast Wet Prep HPF POC NONE SEEN NONE SEEN   Trich, Wet Prep NONE SEEN NONE SEEN   Clue Cells Wet Prep HPF POC NONE SEEN NONE SEEN   WBC, Wet Prep HPF POC >=10 (A) <10   Sperm NONE SEEN    Imaging Studies:    Assessment: Madeline Perkins is  35 y.o. G3P1011 at [redacted]w[redacted]d presents with no evidence for ROM/labor.  Plan: Labor precautions given, dc home.  Joaquim Lai Cresenzo-Dishmon 12/19/20222:29 PM

## 2021-04-08 NOTE — MAU Note (Signed)
Madeline Perkins is a 35 y.o. at [redacted]w[redacted]d here in MAU reporting: started having mild contractions last night, this morning started having lower pelvic pain and pressure. Started noticing some LOF around, it is clear. Last IC was last night. No bleeding. +FM  Onset of complaint: last night  Pain score: 5/10  Vitals:   04/08/21 1336  BP: 125/90  Pulse: 98  Resp: 16  Temp: 98 F (36.7 C)  SpO2: 98%     FHT:160  Lab orders placed from triage: none

## 2021-04-08 NOTE — Discharge Instructions (Signed)

## 2021-04-11 ENCOUNTER — Encounter (HOSPITAL_COMMUNITY): Payer: Self-pay | Admitting: *Deleted

## 2021-04-11 ENCOUNTER — Telehealth (HOSPITAL_COMMUNITY): Payer: Self-pay | Admitting: *Deleted

## 2021-04-11 NOTE — Telephone Encounter (Signed)
Preadmission screen  

## 2021-04-18 ENCOUNTER — Inpatient Hospital Stay (HOSPITAL_COMMUNITY)
Admission: AD | Admit: 2021-04-18 | Discharge: 2021-04-20 | DRG: 806 | Disposition: A | Discharge status: Home / Self Care | Payer: No Typology Code available for payment source | Attending: Obstetrics & Gynecology | Admitting: Obstetrics & Gynecology

## 2021-04-18 ENCOUNTER — Inpatient Hospital Stay (HOSPITAL_COMMUNITY): Payer: No Typology Code available for payment source | Admitting: Anesthesiology

## 2021-04-18 ENCOUNTER — Inpatient Hospital Stay (HOSPITAL_COMMUNITY): Payer: No Typology Code available for payment source

## 2021-04-18 ENCOUNTER — Other Ambulatory Visit: Payer: Self-pay

## 2021-04-18 ENCOUNTER — Encounter (HOSPITAL_COMMUNITY): Payer: Self-pay | Admitting: Obstetrics & Gynecology

## 2021-04-18 DIAGNOSIS — O9962 Diseases of the digestive system complicating childbirth: Secondary | ICD-10-CM | POA: Diagnosis present

## 2021-04-18 DIAGNOSIS — O9902 Anemia complicating childbirth: Secondary | ICD-10-CM | POA: Diagnosis present

## 2021-04-18 DIAGNOSIS — Z3A39 39 weeks gestation of pregnancy: Secondary | ICD-10-CM | POA: Diagnosis not present

## 2021-04-18 DIAGNOSIS — O26893 Other specified pregnancy related conditions, third trimester: Principal | ICD-10-CM | POA: Diagnosis present

## 2021-04-18 DIAGNOSIS — Z3490 Encounter for supervision of normal pregnancy, unspecified, unspecified trimester: Secondary | ICD-10-CM

## 2021-04-18 DIAGNOSIS — O358XX0 Maternal care for other (suspected) fetal abnormality and damage, not applicable or unspecified: Secondary | ICD-10-CM | POA: Diagnosis present

## 2021-04-18 DIAGNOSIS — Z20822 Contact with and (suspected) exposure to covid-19: Secondary | ICD-10-CM | POA: Diagnosis present

## 2021-04-18 DIAGNOSIS — K5190 Ulcerative colitis, unspecified, without complications: Secondary | ICD-10-CM | POA: Diagnosis present

## 2021-04-18 LAB — TYPE AND SCREEN
ABO/RH(D): O POS
Antibody Screen: NEGATIVE

## 2021-04-18 LAB — CBC
HCT: 33.6 % — ABNORMAL LOW (ref 36.0–46.0)
Hemoglobin: 10.7 g/dL — ABNORMAL LOW (ref 12.0–15.0)
MCH: 25 pg — ABNORMAL LOW (ref 26.0–34.0)
MCHC: 31.8 g/dL (ref 30.0–36.0)
MCV: 78.5 fL — ABNORMAL LOW (ref 80.0–100.0)
Platelets: 297 10*3/uL (ref 150–400)
RBC: 4.28 MIL/uL (ref 3.87–5.11)
RDW: 14 % (ref 11.5–15.5)
WBC: 11.7 10*3/uL — ABNORMAL HIGH (ref 4.0–10.5)
nRBC: 0 % (ref 0.0–0.2)

## 2021-04-18 LAB — RPR: RPR Ser Ql: NONREACTIVE

## 2021-04-18 MED ORDER — FENTANYL-BUPIVACAINE-NACL 0.5-0.125-0.9 MG/250ML-% EP SOLN: 12.0000 mL/h | EPIDURAL | Status: DC | PRN

## 2021-04-18 MED ORDER — LACTATED RINGERS IV SOLN: 500.0000 mL | Freq: Once | INTRAVENOUS | Status: DC

## 2021-04-18 MED ORDER — SOD CITRATE-CITRIC ACID 500-334 MG/5ML PO SOLN: 30.0000 mL | ORAL | Status: DC | PRN

## 2021-04-18 MED ORDER — OXYTOCIN-SODIUM CHLORIDE 30-0.9 UT/500ML-% IV SOLN
1.0000 m[IU]/min | INTRAVENOUS | Status: DC
  Administered 2021-04-18: 08:00:00 2 m[IU]/min via INTRAVENOUS
  Filled 2021-04-18: qty 500

## 2021-04-18 MED ORDER — OXYCODONE-ACETAMINOPHEN 5-325 MG PO TABS: 1.0000 | ORAL_TABLET | ORAL | Status: DC | PRN

## 2021-04-18 MED ORDER — TERBUTALINE SULFATE 1 MG/ML IJ SOLN: 0.2500 mg | Freq: Once | INTRAMUSCULAR | Status: DC | PRN

## 2021-04-18 MED ORDER — ACETAMINOPHEN 325 MG PO TABS: 650.0000 mg | ORAL_TABLET | ORAL | Status: DC | PRN

## 2021-04-18 MED ORDER — FENTANYL-BUPIVACAINE-NACL 0.5-0.125-0.9 MG/250ML-% EP SOLN
12.0000 mL/h | EPIDURAL | Status: DC | PRN
  Administered 2021-04-18: 11:00:00 12 mL/h via EPIDURAL
  Filled 2021-04-18: qty 250

## 2021-04-18 MED ORDER — ZOLPIDEM TARTRATE 5 MG PO TABS: 5.0000 mg | ORAL_TABLET | Freq: Every evening | ORAL | Status: DC | PRN

## 2021-04-18 MED ORDER — LIDOCAINE-EPINEPHRINE (PF) 2 %-1:200000 IJ SOLN
INTRAMUSCULAR | Status: DC | PRN
  Administered 2021-04-18: 5 mL via EPIDURAL

## 2021-04-18 MED ORDER — OXYTOCIN-SODIUM CHLORIDE 30-0.9 UT/500ML-% IV SOLN: 2.5000 [IU]/h | INTRAVENOUS | Status: DC

## 2021-04-18 MED ORDER — LACTATED RINGERS IV SOLN: 500.0000 mL | INTRAVENOUS | Status: DC | PRN

## 2021-04-18 MED ORDER — EPHEDRINE 5 MG/ML INJ: 10.0000 mg | INTRAVENOUS | Status: DC | PRN

## 2021-04-18 MED ORDER — DIPHENHYDRAMINE HCL 50 MG/ML IJ SOLN: 12.5000 mg | INTRAMUSCULAR | Status: DC | PRN

## 2021-04-18 MED ORDER — PHENYLEPHRINE 40 MCG/ML (10ML) SYRINGE FOR IV PUSH (FOR BLOOD PRESSURE SUPPORT): 80.0000 ug | PREFILLED_SYRINGE | INTRAVENOUS | Status: DC | PRN

## 2021-04-18 MED ORDER — FENTANYL CITRATE (PF) 100 MCG/2ML IJ SOLN: 50.0000 ug | INTRAMUSCULAR | Status: DC | PRN

## 2021-04-18 MED ORDER — LACTATED RINGERS IV SOLN: INTRAVENOUS | Status: DC

## 2021-04-18 MED ORDER — OXYCODONE-ACETAMINOPHEN 5-325 MG PO TABS: 2.0000 | ORAL_TABLET | ORAL | Status: DC | PRN

## 2021-04-18 MED ORDER — LIDOCAINE HCL (PF) 1 % IJ SOLN: 30.0000 mL | INTRAMUSCULAR | Status: DC | PRN

## 2021-04-18 MED ORDER — ONDANSETRON HCL 4 MG/2ML IJ SOLN: 4.0000 mg | Freq: Four times a day (QID) | INTRAMUSCULAR | Status: DC | PRN

## 2021-04-18 MED ORDER — OXYTOCIN BOLUS FROM INFUSION
333.0000 mL | Freq: Once | INTRAVENOUS | Status: AC
  Administered 2021-04-18: 21:00:00 333 mL via INTRAVENOUS

## 2021-04-18 MED ORDER — MISOPROSTOL 25 MCG QUARTER TABLET: 25.0000 ug | ORAL_TABLET | ORAL | Status: DC | PRN

## 2021-04-18 NOTE — Progress Notes (Signed)
Pt tested for COVID at preprocedure testing site at [facility] no result yet. Tested on 04/16/21.

## 2021-04-18 NOTE — Lactation Note (Signed)
This note was copied from a baby's chart. Lactation Consultation Note  Patient Name: Madeline Perkins [id number] Date: 04/18/2021   Age:35 hours Per RN Ozella Almond), on vocera  Mom declined [facility] services on L&D . Mom  will inform RN on MBU if she desires to have [facility] services on MBU.  Maternal Data    Feeding    LATCH Score                    Lactation Tools Discussed/Used    Interventions    Discharge    Consult Status      Vicente Serene 04/18/2021, 10:10 PM

## 2021-04-18 NOTE — Anesthesia Preprocedure Evaluation (Addendum)
Anesthesia Evaluation  Patient identified by MRN, date of birth, ID band Patient awake    Reviewed: Allergy & Precautions, NPO status , Patient's Chart, lab work & pertinent test results  Airway Mallampati: I       Dental no notable dental hx.    Pulmonary neg pulmonary ROS,    Pulmonary exam normal        Cardiovascular hypertension, Normal cardiovascular exam     Neuro/Psych Anxiety negative neurological ROS     GI/Hepatic Neg liver ROS, PUD, GERD  Medicated,  Endo/Other  negative endocrine ROS  Renal/GU negative Renal ROS     Musculoskeletal   Abdominal   Peds  Hematology   Anesthesia Other Findings   Reproductive/Obstetrics (+) Pregnancy                            Anesthesia Physical Anesthesia Plan  ASA: 2  Anesthesia Plan: Epidural   Post-op Pain Management:    Induction:   PONV Risk Score and Plan: 0  Airway Management Planned: Natural Airway  Additional Equipment: None  Intra-op Plan:   Post-operative Plan:   Informed Consent: I have reviewed the patients History and Physical, chart, labs and discussed the procedure including the risks, benefits and alternatives for the proposed anesthesia with the patient or authorized representative who has indicated his/her understanding and acceptance.       Plan Discussed with:   Anesthesia Plan Comments: (Lab Results      Component                Value               Date                      WBC                      11.7 (H)            04/18/2021                HGB                      10.7 (L)            04/18/2021                HCT                      33.6 (L)            04/18/2021                MCV                      78.5 (L)            04/18/2021                PLT                      297                 04/18/2021           )       Anesthesia Quick Evaluation

## 2021-04-18 NOTE — Anesthesia Procedure Notes (Signed)
Epidural Patient location during procedure: OB Start time: 04/18/2021 11:01 AM End time: 04/18/2021 11:07 AM  Staffing Anesthesiologist: Effie Berkshire, MD Performed: anesthesiologist   Preanesthetic Checklist Completed: patient identified, IV checked, site marked, risks and benefits discussed, surgical consent, monitors and equipment checked, pre-op evaluation and timeout performed  Epidural Patient position: sitting Prep: DuraPrep Patient monitoring: heart rate, continuous pulse ox and blood pressure Approach: midline Location: L3-L4 Injection technique: LOR saline  Needle:  Needle type: Tuohy  Needle gauge: 17 G Needle length: 9 cm Catheter type: closed end flexible Catheter size: 20 Guage Test dose: negative and 1.5% lidocaine  Assessment Events: blood not aspirated, injection not painful, no injection resistance and no paresthesia  Additional Notes LOR @ 5  Patient identified. Risks/Benefits/Options discussed with patient including but not limited to bleeding, infection, nerve damage, paralysis, failed block, incomplete pain control, headache, blood pressure changes, nausea, vomiting, reactions to medications, itching and postpartum back pain. Confirmed with bedside nurse the patient's most recent platelet count. Confirmed with patient that they are not currently taking any anticoagulation, have any bleeding history or any family history of bleeding disorders. Patient expressed understanding and wished to proceed. All questions were answered. Sterile technique was used throughout the entire procedure. Please see nursing notes for vital signs. Test dose was given through epidural catheter and negative prior to continuing to dose epidural or start infusion. Warning signs of high block given to the patient including shortness of breath, tingling/numbness in hands, complete motor block, or any concerning symptoms with instructions to call for help. Patient was given instructions  on fall risk and not to get out of bed. All questions and concerns addressed with instructions to call with any issues or inadequate analgesia.    Reason for block:procedure for pain

## 2021-04-18 NOTE — H&P (Signed)
Madeline Perkins is a 35 y.o. female G3P1011 at 38w1dpresenting for elective IOL.  Antepartum course complicated by Ulcerative colitis; stable this pregnancy on Lialda.  Patient has h/o GHTN in previous pregnancy and has been normotensive during current pregnancy; has taken low dose ASA.  Fetus with bilateral pyelectasis and was seen by MFM with recommendation to do postnatal evaluation of newborn kidneys.  Patient's last u/s was on 12/21 and EFW 8#6 with large AC; pelvis proven to 8#10.  Patient has h/o melanoma.  GBS negative.    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   GERD (gastroesophageal reflux disease)    Intermittent palpitations    w/u by cardio neg (Dr CStanford Breed, Toprol prn   Pregnancy induced hypertension    Situational anxiety    Ulcerative colitis ([facility]    Past Surgical History:  Procedure Laterality Date   APPENDECTOMY     BREAST ENHANCEMENT SURGERY     BREAST SURGERY  2012   LAPAROSCOPIC APPENDECTOMY  05/03/2012   Procedure: APPENDECTOMY LAPAROSCOPIC;  Surgeon: JGwenyth Ober MD;  [facility]  Service: General;  Laterality: N/A;   MELANOMA EXCISION WITH SENTINEL LYMPH NODE BIOPSY N/A 08/10/2012   Procedure: RE-EXCISION BACK MELANOMA WITH SENTINEL LEFT INGUINAL LYMPH NODE BIOPSY;  Surgeon: JGwenyth Ober MD;  [facility]  Service: General;  Laterality: N/A;   WISDOM TOOTH EXTRACTION  02/2008   Family History: family history includes Cancer in her mother and paternal grandfather; Diabetes in her maternal grandmother; Hypertension in her father and mother; Liver cancer in her mother; Other in her mother. Social History:  reports that she has never smoked. She has never used smokeless tobacco. She reports that she does not currently use  alcohol. She reports that she does not use drugs.     Maternal Diabetes: No Genetic Screening: Normal Maternal Ultrasounds/Referrals: Fetal Kidney Anomalies Fetal Ultrasounds or other Referrals:  Referred to Materal Fetal Medicine  Maternal Substance Abuse:  No Significant Maternal Medications:  Meds include: Other: Lialda Significant Maternal Lab Results:  Group B Strep negative Other Comments:  None  Review of Systems Maternal Medical History:  Contractions: Frequency: rare.   Perceived severity is mild.   Fetal activity: Perceived fetal activity is normal.   Last perceived fetal movement was within the past hour.   Prenatal complications: no prenatal complications Prenatal Complications - Diabetes: none.  Dilation: 3.5 Effacement (%): 70 Station: -3 Exam by:: Naesha Buckalew Blood pressure 111/76, pulse 75, temperature 97.7 F (36.5 C), temperature source Oral, height 5' 10"  (1.778 m), weight 101.2 kg, last menstrual period 07/18/2020, unknown if currently breastfeeding. Maternal Exam:  Uterine Assessment: Contraction strength is mild.  Contraction frequency is rare.  Abdomen: Patient reports no abdominal tenderness. Fundal height is S>D.   Estimated fetal weight is 8#12.   Fetal presentation: vertex Introitus: Normal vulva. Pelvis: adequate for delivery.   Cervix: Cervix evaluated by digital exam.     Fetal Exam Fetal Monitor Review: Baseline rate:  120.  Variability: moderate (6-25 bpm).   Pattern: accelerations present and no decelerations.   Fetal State Assessment: Category I - tracings are normal.  Physical Exam Constitutional:      Appearance: Normal appearance.  HENT:     Head: Normocephalic and atraumatic.  Pulmonary:     Effort: Pulmonary effort is normal.  Abdominal:     Palpations: Abdomen is soft.  Genitourinary:    General: Normal vulva.  Musculoskeletal:        General: Normal range of motion.     Cervical back: Normal range of motion.  Skin:     General: Skin is warm and dry.  Neurological:     Mental Status: She is alert and oriented to person, place, and time.  Psychiatric:        Mood and Affect: Mood normal.        Behavior: Behavior normal.    Prenatal labs: ABO, Rh: --/--/PENDING (12/29 0725) Antibody: PENDING (12/29 0725) Rubella: Immune (05/26 0000) RPR: Nonreactive (05/26 0000)  HBsAg: Negative (05/26 0000)  HIV: Non-reactive (05/26 0000)  GBS: Negative/-- (12/08 0000)   Assessment/Plan: 03BC [id number] at 68w1dfor elective IOL -Start pitocin 2/2 -CLEA when ready -AROM when able -Anticipate NSVD -H/O melanoma-plan to send placenta to path -Bilateral pyelectasis-f/u newborn kidneys with peds   MLinda Hedges12/29/2022, 8:23 AM

## 2021-04-18 NOTE — Progress Notes (Signed)
Madeline Perkins is a 35 y.o. G3P1011 at 2w1dby ultrasound admitted for induction of labor due to Elective at term.  Subjective: Comfortable with CLEA  Objective: BP 122/75    Pulse 66    Temp 97.7 F (36.5 C) (Oral)    Ht 5' 10"  (1.778 m)    Wt 101.2 kg    LMP 07/18/2020    SpO2 100%    BMI 32.00 kg/m  No intake/output data recorded. No intake/output data recorded.  FHT:  FHR: 120 bpm, variability: moderate,  accelerations:  Present,  decelerations:  Absent UC:   regular, every 2-4 minutes SVE:   Dilation: 5 Effacement (%): 70 Station: -2 Exam by:: Madeline Perkins AROM, clear  Labs: Lab Results  Component Value Date   WBC 11.7 (H) 04/18/2021   HGB 10.7 (L) 04/18/2021   HCT 33.6 (L) 04/18/2021   MCV 78.5 (L) 04/18/2021   PLT 297 04/18/2021    Assessment / Plan: Induction of labor due to elective at term,  progressing well on pitocin  Labor: Progressing normally Preeclampsia:   n/a Fetal Wellbeing:  Category I Pain Control:  Epidural I/D:  n/a Anticipated MOD:  NSVD  Madeline Perkins 04/18/2021, 1:40 PM

## 2021-04-18 NOTE — Progress Notes (Signed)
Pt's COVID test done on 04/16/21 received via fax by [facility]. Result is negative. Copy put in bin to scan into pt's chart.

## 2021-04-19 LAB — CBC
HCT: 30.9 % — ABNORMAL LOW (ref 36.0–46.0)
Hemoglobin: 9.6 g/dL — ABNORMAL LOW (ref 12.0–15.0)
MCH: 24.7 pg — ABNORMAL LOW (ref 26.0–34.0)
MCHC: 31.1 g/dL (ref 30.0–36.0)
MCV: 79.6 fL — ABNORMAL LOW (ref 80.0–100.0)
Platelets: 266 10*3/uL (ref 150–400)
RBC: 3.88 MIL/uL (ref 3.87–5.11)
RDW: 13.9 % (ref 11.5–15.5)
WBC: 14.7 10*3/uL — ABNORMAL HIGH (ref 4.0–10.5)
nRBC: 0 % (ref 0.0–0.2)

## 2021-04-19 MED ORDER — MESALAMINE 1.2 G PO TBEC
4.8000 g | DELAYED_RELEASE_TABLET | Freq: Every day | ORAL | Status: DC
  Administered 2021-04-19 – 2021-04-20 (×2): 4.8 g via ORAL
  Filled 2021-04-19 (×2): qty 4

## 2021-04-19 MED ORDER — IBUPROFEN 600 MG PO TABS
600.0000 mg | ORAL_TABLET | Freq: Four times a day (QID) | ORAL | Status: DC
  Administered 2021-04-19 – 2021-04-20 (×5): 600 mg via ORAL
  Filled 2021-04-19 (×5): qty 1

## 2021-04-19 MED ORDER — WITCH HAZEL-GLYCERIN EX PADS
1.0000 "application " | MEDICATED_PAD | CUTANEOUS | Status: DC | PRN
  Administered 2021-04-19: 1 via TOPICAL

## 2021-04-19 MED ORDER — SIMETHICONE 80 MG PO CHEW: 80.0000 mg | CHEWABLE_TABLET | ORAL | Status: DC | PRN

## 2021-04-19 MED ORDER — BENZOCAINE-MENTHOL 20-0.5 % EX AERO
1.0000 "application " | INHALATION_SPRAY | CUTANEOUS | Status: DC | PRN
  Administered 2021-04-19: 1 via TOPICAL
  Filled 2021-04-19: qty 56

## 2021-04-19 MED ORDER — PRENATAL MULTIVITAMIN CH
1.0000 | ORAL_TABLET | Freq: Every day | ORAL | Status: DC
  Administered 2021-04-19: 11:00:00 1 via ORAL
  Filled 2021-04-19: qty 1

## 2021-04-19 MED ORDER — OXYCODONE-ACETAMINOPHEN 5-325 MG PO TABS: 2.0000 | ORAL_TABLET | ORAL | Status: DC | PRN

## 2021-04-19 MED ORDER — ONDANSETRON HCL 4 MG/2ML IJ SOLN: 4.0000 mg | INTRAMUSCULAR | Status: DC | PRN

## 2021-04-19 MED ORDER — OXYCODONE-ACETAMINOPHEN 5-325 MG PO TABS: 1.0000 | ORAL_TABLET | ORAL | Status: DC | PRN

## 2021-04-19 MED ORDER — COCONUT OIL OIL: 1.0000 "application " | TOPICAL_OIL | Status: DC | PRN

## 2021-04-19 MED ORDER — DIPHENHYDRAMINE HCL 25 MG PO CAPS: 25.0000 mg | ORAL_CAPSULE | Freq: Four times a day (QID) | ORAL | Status: DC | PRN

## 2021-04-19 MED ORDER — ACETAMINOPHEN 325 MG PO TABS
650.0000 mg | ORAL_TABLET | ORAL | Status: DC | PRN
  Administered 2021-04-19 – 2021-04-20 (×3): 650 mg via ORAL
  Filled 2021-04-19 (×3): qty 2

## 2021-04-19 MED ORDER — DIBUCAINE (PERIANAL) 1 % EX OINT: 1.0000 "application " | TOPICAL_OINTMENT | CUTANEOUS | Status: DC | PRN

## 2021-04-19 MED ORDER — ZOLPIDEM TARTRATE 5 MG PO TABS: 5.0000 mg | ORAL_TABLET | Freq: Every evening | ORAL | Status: DC | PRN

## 2021-04-19 MED ORDER — TETANUS-DIPHTH-ACELL PERTUSSIS 5-2.5-18.5 LF-MCG/0.5 IM SUSY: 0.5000 mL | PREFILLED_SYRINGE | Freq: Once | INTRAMUSCULAR | Status: DC

## 2021-04-19 MED ORDER — SENNOSIDES-DOCUSATE SODIUM 8.6-50 MG PO TABS
2.0000 | ORAL_TABLET | Freq: Every day | ORAL | Status: DC
  Administered 2021-04-19 – 2021-04-20 (×2): 2 via ORAL
  Filled 2021-04-19 (×2): qty 2

## 2021-04-19 MED ORDER — ONDANSETRON HCL 4 MG PO TABS: 4.0000 mg | ORAL_TABLET | ORAL | Status: DC | PRN

## 2021-04-19 NOTE — Progress Notes (Signed)
Post Partum Day 1 eIOL (arom/pitocin).  Subjective: no complaints, up ad lib, voiding, and tolerating PO  Objective: Blood pressure 109/76, pulse 74, temperature 98.2 F (36.8 C), temperature source Oral, resp. rate 16, height 5' 10"  (1.778 m), weight 101.2 kg, last menstrual period 07/18/2020, SpO2 100 %, unknown if currently breastfeeding.  Physical Exam:  General: alert Lochia: appropriate Uterine Fundus: firm Incision: N/A DVT Evaluation: No evidence of DVT seen on physical exam.  Recent Labs    04/18/21 0725 04/19/21 0502  HGB 10.7* 9.6*  HCT 33.6* 30.9*    Assessment/Plan: Plan for discharge tomorrow and Circumcision prior to discharge - baby not seen by peds yet but has voided (hx bilateral pelviectasis with postnatal imaging planned).  Hx gHTN with previous pregnancy  - no s/s currently.  Anemia - appropriate drop. Continue PNV with iron.    LOS: 1 day   Madeline Perkins 04/19/2021, 9:37 AM

## 2021-04-19 NOTE — Anesthesia Postprocedure Evaluation (Signed)
Anesthesia Post Note  Patient: Madeline Perkins  Procedure(s) Performed: AN AD HOC LABOR EPIDURAL     Patient location during evaluation: Mother Baby Anesthesia Type: Epidural Level of consciousness: awake and alert and oriented Pain management: satisfactory to patient Vital Signs Assessment: post-procedure vital signs reviewed and stable Respiratory status: respiratory function stable Cardiovascular status: stable Postop Assessment: no headache, no backache, epidural receding, patient able to bend at knees, no signs of nausea or vomiting, adequate PO intake and able to ambulate Anesthetic complications: no   No notable events documented.  Last Vitals:  Vitals:   04/19/21 0050 04/19/21 0520  BP: 123/83 113/78  Pulse: 67 77  Resp: 16 16  Temp: 36.6 C 36.7 C  SpO2: 100% 100%    Last Pain:  Vitals:   04/19/21 0520  TempSrc: Oral  PainSc: 6    Pain Goal:                   [name]

## 2021-04-20 NOTE — Discharge Summary (Signed)
Postpartum Discharge Summary     Patient Name: Madeline Perkins DOB: 1985/06/08 MRN: [id number]  Date of admission: 04/18/2021 Delivery date:04/18/2021  Delivering provider: Linda Hedges  Date of discharge: 04/20/2021  Admitting diagnosis: Pregnancy [Z34.90] Intrauterine pregnancy: [redacted]w[redacted]d    Secondary diagnosis:  Principal Problem:   Pregnancy  Additional problems: None    Discharge diagnosis: Term Pregnancy Delivered                                              Post partum procedures: None Augmentation: AROM and Pitocin Complications: None  Hospital course: Induction of Labor With Vaginal Delivery   35y.o. yo [id number] 3110w1das admitted to the hospital 04/18/2021 for induction of labor.  Indication for induction: Elective.  Patient had an uncomplicated labor course as follows: Membrane Rupture Time/Date: 1:37 PM ,04/18/2021   Delivery Method:Vaginal, Spontaneous  Episiotomy: None  Lacerations:  2nd degree  Details of delivery can be found in separate delivery note.  Patient had a routine postpartum course. Patient is discharged home 04/20/21.  Newborn Data: Birth date:04/18/2021  Birth time:9:12 PM  Gender:Female  Living status:Living  Apgars:9 ,9  Weight:4230 g   Magnesium Sulfate received: No BMZ received: No Rhophylac:N/A MMR:N/A T-DaP:Given prenatally Flu: N/A Transfusion:No  Physical exam  Vitals:   04/19/21 0520 04/19/21 0910 04/19/21 1330 04/20/21 0623  BP: 113/78 109/76 109/71 113/68  Pulse: 77 74 73 68  Resp: _0 Temp: 98 F (36.7 C) 98.2 F (36.8 C) 97.7 F (36.5 C) 98.2 F (36.8 C)  TempSrc: Oral Oral Oral Oral  SpO2: 100% 100% 98%   Weight:      Height:       General: alert Lochia: appropriate Uterine Fundus: firm Incision: N/A DVT Evaluation: No evidence of DVT seen on physical exam. Labs: Lab Results  Component Value Date   WBC 14.7 (H) 04/19/2021   HGB 9.6 (L) 04/19/2021   HCT 30.9 (L) 04/19/2021   MCV 79.6 (L)  04/19/2021   PLT 266 04/19/2021   CMP Latest Ref Rng & Units 07/09/2020  Glucose 70 - 99 mg/dL 87  BUN 6 - 20 mg/dL 13  Creatinine 0.44 - 1.00 mg/dL 0.95  Sodium 135 - 145 mmol/L 137  Potassium 3.5 - 5.1 mmol/L 4.2  Chloride 98 - 111 mmol/L 103  CO2 22 - 32 mmol/L 26  Calcium 8.9 - 10.3 mg/dL 9.9  Total Protein 6.5 - 8.1 g/dL 7.2  Total Bilirubin 0.3 - 1.2 mg/dL 0.4  Alkaline Phos 38 - 126 U/L 63  AST 15 - 41 U/L 12(L)  ALT 0 - 44 U/L 11   Edinburgh Score: Edinburgh Postnatal Depression Scale Screening Tool 04/19/2021  I have been able to laugh and see the funny side of things. 0  I have looked forward with enjoyment to things. 0  I have blamed myself unnecessarily when things went wrong. 0  I have been anxious or worried for no good reason. 0  I have felt scared or panicky for no good reason. 0  Things have been getting on top of me. 0  I have been so unhappy that I have had difficulty sleeping. 0  I have felt sad or miserable. 0  I have been so unhappy that I have been crying. 0  The thought of harming myself has occurred  to me. 0  Edinburgh Postnatal Depression Scale Total 0      After visit meds:  Allergies as of 04/20/2021       Reactions   Penicillins Hives   Childhood allergy Has patient had a PCN reaction causing immediate rash, facial/tongue/throat swelling, SOB or lightheadedness with hypotension: Yes Has patient had a PCN reaction causing severe rash involving mucus membranes or skin necrosis: No Has patient had a PCN reaction that required hospitalization: No Has patient had a PCN reaction occurring within the last 10 years: No If all of the above answers are "NO", then may proceed with Cephalosporin use.        Medication List     TAKE these medications    Lialda 1.2 g EC tablet Generic drug: mesalamine Take 4 tablets (4.8 g total) by mouth daily.   omeprazole 20 MG capsule Commonly known as: PRILOSEC Take 20 mg by mouth 2 (two) times daily  before a meal.   prenatal multivitamin Tabs tablet Take 1 tablet by mouth daily at 12 noon.         Discharge home in stable condition Infant Feeding: Bottle and Breast Infant Disposition:home with mother Discharge instruction: per After Visit Summary and Postpartum booklet. Activity: Advance as tolerated. Pelvic rest for 6 weeks.  Diet: routine diet Anticipated Birth Control: Unsure Postpartum Appointment:6 weeks Additional Postpartum F/U:  None Future Appointments:No future appointments. Follow up Visit:      04/20/2021 Tyson Dense, MD

## 2021-04-25 LAB — SURGICAL PATHOLOGY

## 2021-04-30 ENCOUNTER — Telehealth (HOSPITAL_COMMUNITY): Payer: Self-pay

## 2021-04-30 NOTE — Telephone Encounter (Signed)
"  Doing pretty good. My swelling went down. I'm feeling better. I'm still leaking fluid its more of a clear color. I think it's called lochia." RN reviewed normal lochia color progression, amounts, and duration of bleeding. RN also reviewed what to much bleeding looks like and when to call the OB-GYN. "The color changed to more of a brown yesterday." Patient has no other concerns or questions about her healing.  "He's good. He is getting bigger, he is back up to 9lbs. He is feeding good. He sleeps in a bassinet." RN reviewed ABC's of safe sleep with patient. Patient declines any questions or concerns about baby.  EPDS score is 0.  Sharyn Lull [facility] 04/30/2021,1517

## 2021-05-09 ENCOUNTER — Other Ambulatory Visit (HOSPITAL_COMMUNITY): Payer: Self-pay

## 2021-07-19 ENCOUNTER — Other Ambulatory Visit (HOSPITAL_COMMUNITY): Payer: Self-pay

## 2021-07-19 MED ORDER — MESALAMINE 1.2 G PO TBEC
4.8000 g | DELAYED_RELEASE_TABLET | Freq: Every day | ORAL | 5 refills | Status: AC
  Filled 2021-07-19: qty 120, 30d supply, fill #0
  Filled 2021-10-31: qty 120, 30d supply, fill #1
  Filled 2022-01-21: qty 120, 30d supply, fill #2
  Filled 2022-04-04: qty 120, 30d supply, fill #3
  Filled 2022-06-27: qty 120, 30d supply, fill #4

## 2021-10-31 ENCOUNTER — Other Ambulatory Visit (HOSPITAL_COMMUNITY): Payer: Self-pay

## 2022-01-21 ENCOUNTER — Other Ambulatory Visit (HOSPITAL_COMMUNITY): Payer: Self-pay

## 2022-01-27 ENCOUNTER — Telehealth: Payer: No Typology Code available for payment source | Admitting: Family

## 2022-01-27 DIAGNOSIS — J069 Acute upper respiratory infection, unspecified: Secondary | ICD-10-CM | POA: Diagnosis not present

## 2022-01-27 MED ORDER — BENZONATATE 100 MG PO CAPS: 100.0000 mg | ORAL_CAPSULE | Freq: Three times a day (TID) | ORAL | 0 refills | Status: DC | PRN

## 2022-01-27 MED ORDER — FLUTICASONE PROPIONATE 50 MCG/ACT NA SUSP: 2.0000 | Freq: Every day | NASAL | 6 refills | Status: DC

## 2022-01-27 NOTE — Progress Notes (Signed)

## 2022-04-22 ENCOUNTER — Telehealth: Payer: 59 | Admitting: Physician Assistant

## 2022-04-22 DIAGNOSIS — J019 Acute sinusitis, unspecified: Secondary | ICD-10-CM

## 2022-04-22 DIAGNOSIS — B9689 Other specified bacterial agents as the cause of diseases classified elsewhere: Secondary | ICD-10-CM

## 2022-04-22 MED ORDER — DOXYCYCLINE HYCLATE 100 MG PO TABS
100.0000 mg | ORAL_TABLET | Freq: Two times a day (BID) | ORAL | 0 refills | Status: DC
Start: 2022-04-22 — End: 2022-09-23

## 2022-04-22 MED ORDER — FLUTICASONE PROPIONATE 50 MCG/ACT NA SUSP: 2.0000 | Freq: Every day | NASAL | 0 refills | Status: AC

## 2022-04-22 NOTE — Patient Instructions (Signed)
Madeline Perkins, thank you for joining Leeanne Rio, PA-C for today's virtual visit.  While this provider is not your primary care provider (PCP), if your PCP is located in our provider database this encounter information will be shared with them immediately following your visit.   [facility] account gives you access to today's visit and all your visits, tests, and labs performed at [facility] " click here if you don't have a [facility] [facility] account or go to [email or web address]  Consent: (Patient) Madeline Perkins provided verbal consent for this virtual visit at the beginning of the encounter.  Current Medications:  Current Outpatient Medications:    benzonatate (TESSALON PERLES) 100 MG capsule, Take 1 capsule (100 mg total) by mouth 3 (three) times daily as needed., Disp: 20 capsule, Rfl: 0   fluticasone (FLONASE) 50 MCG/ACT nasal spray, Place 2 sprays into both nostrils daily., Disp: 16 g, Rfl: 6   mesalamine (LIALDA) 1.2 g EC tablet, Take 4 tablets (4.8 g total) by mouth daily., Disp: 120 tablet, Rfl: 5   omeprazole (PRILOSEC) 20 MG capsule, Take 20 mg by mouth 2 (two) times daily before a meal., Disp: , Rfl:    Prenatal Vit-Fe Fumarate-FA (PRENATAL MULTIVITAMIN) TABS tablet, Take 1 tablet by mouth daily at 12 noon., Disp: , Rfl:    Medications ordered in this encounter:  No orders of the defined types were placed in this encounter.    *If you need refills on other medications prior to your next appointment, please contact your pharmacy*  Follow-Up: Call back or seek an in-person evaluation if the symptoms worsen or if the condition fails to improve as anticipated.  [place] [phone]  Other Instructions Please take antibiotic as directed.  Increase fluid intake.  Use Saline nasal spray.  Take a daily multivitamin. Use the Flonase as directed.  Place a humidifier in the bedroom.  Please call or return clinic if symptoms are  not improving.  Sinusitis Sinusitis is redness, soreness, and swelling (inflammation) of the paranasal sinuses. Paranasal sinuses are air pockets within the bones of your face (beneath the eyes, the middle of the forehead, or above the eyes). In healthy paranasal sinuses, mucus is able to drain out, and air is able to circulate through them by way of your nose. However, when your paranasal sinuses are inflamed, mucus and air can become trapped. This can allow bacteria and other germs to grow and cause infection. Sinusitis can develop quickly and last only a short time (acute) or continue over a long period (chronic). Sinusitis that lasts for more than 12 weeks is considered chronic.  CAUSES  Causes of sinusitis include: Allergies. Structural abnormalities, such as displacement of the cartilage that separates your nostrils (deviated septum), which can decrease the air flow through your nose and sinuses and affect sinus drainage. Functional abnormalities, such as when the small hairs (cilia) that line your sinuses and help remove mucus do not work properly or are not present. SYMPTOMS  Symptoms of acute and chronic sinusitis are the same. The primary symptoms are pain and pressure around the affected sinuses. Other symptoms include: Upper toothache. Earache. Headache. Bad breath. Decreased sense of smell and taste. A cough, which worsens when you are lying flat. Fatigue. Fever. Thick drainage from your nose, which often is green and may contain pus (purulent). Swelling and warmth over the affected sinuses. DIAGNOSIS  Your caregiver will perform a physical exam. During the exam, your caregiver may:  Look in your nose for signs of abnormal growths in your nostrils (nasal polyps). Tap over the affected sinus to check for signs of infection. View the inside of your sinuses (endoscopy) with a special imaging device with a light attached (endoscope), which is inserted into your sinuses. If your  caregiver suspects that you have chronic sinusitis, one or more of the following tests may be recommended: Allergy tests. Nasal culture A sample of mucus is taken from your nose and sent to a lab and screened for bacteria. Nasal cytology A sample of mucus is taken from your nose and examined by your caregiver to determine if your sinusitis is related to an allergy. TREATMENT  Most cases of acute sinusitis are related to a viral infection and will resolve on their own within 10 days. Sometimes medicines are prescribed to help relieve symptoms (pain medicine, decongestants, nasal steroid sprays, or saline sprays).  However, for sinusitis related to a bacterial infection, your caregiver will prescribe antibiotic medicines. These are medicines that will help kill the bacteria causing the infection.  Rarely, sinusitis is caused by a fungal infection. In theses cases, your caregiver will prescribe antifungal medicine. For some cases of chronic sinusitis, surgery is needed. Generally, these are cases in which sinusitis recurs more than 3 times per year, despite other treatments. HOME CARE INSTRUCTIONS  Drink plenty of water. Water helps thin the mucus so your sinuses can drain more easily. Use a humidifier. Inhale steam 3 to 4 times a day (for example, sit in the bathroom with the shower running). Apply a warm, moist washcloth to your face 3 to 4 times a day, or as directed by your caregiver. Use saline nasal sprays to help moisten and clean your sinuses. Take over-the-counter or prescription medicines for pain, discomfort, or fever only as directed by your caregiver. SEEK IMMEDIATE MEDICAL CARE IF: You have increasing pain or severe headaches. You have nausea, vomiting, or drowsiness. You have swelling around your face. You have vision problems. You have a stiff neck. You have difficulty breathing. MAKE SURE YOU:  Understand these instructions. Will watch your condition. Will get help right away  if you are not doing well or get worse. Document Released: 04/07/2005 Document Revised: 06/30/2011 Document Reviewed: 04/22/2011 [facility] Patient Information 2014 [place], [place].    If you have been instructed to have an in-person evaluation today at a local Urgent Care facility, please use the link below. It will take you to a list of all of our available [facility], including address, phone number and hours of operation. Please do not delay care.  [facility]  If you or a family member do not have a primary care provider, use the link below to schedule a visit and establish care. When you choose a [facility] primary care physician or advanced practice provider, you gain a long-term partner in health. Find a Primary Care Provider  Learn more about [facility]'s in-office and virtual care options: [facility]

## 2022-04-22 NOTE — Progress Notes (Signed)
Virtual Visit Consent   Madeline Perkins, you are scheduled for a virtual visit with a [facility] provider today. Just as with appointments in the office, your consent must be obtained to participate. Your consent will be active for this visit and any virtual visit you may have with one of our providers in the next 365 days. If you have a MyChart account, a copy of this consent can be sent to you electronically.  As this is a virtual visit, video technology does not allow for your provider to perform a traditional examination. This may limit your provider's ability to fully assess your condition. If your provider identifies any concerns that need to be evaluated in person or the need to arrange testing (such as labs, EKG, etc.), we will make arrangements to do so. Although advances in technology are sophisticated, we cannot ensure that it will always work on either your end or our end. If the connection with a video visit is poor, the visit may have to be switched to a telephone visit. With either a video or telephone visit, we are not always able to ensure that we have a secure connection.  By engaging in this virtual visit, you consent to the provision of healthcare and authorize for your insurance to be billed (if applicable) for the services provided during this visit. Depending on your insurance coverage, you may receive a charge related to this service.  I need to obtain your verbal consent now. Are you willing to proceed with your visit today? AKEIA PEROT has provided verbal consent on 04/22/2022 for a virtual visit (video or telephone). Madeline Perkins, [place]  Date: 04/22/2022 12:00 PM  Virtual Visit via Video Note   I, Madeline Perkins, connected with  Madeline Perkins  ([id number], 02/25/1986) on 04/22/22 at 11:45 AM EST by a video-enabled telemedicine application and verified that I am speaking with the correct person using two identifiers.  Location: Patient: Virtual Visit Location  Patient: Home Provider: Virtual Visit Location Provider: Home Office   I discussed the limitations of evaluation and management by telemedicine and the availability of in person appointments. The patient expressed understanding and agreed to proceed.    History of Present Illness: Madeline Perkins is a 37 y.o. who identifies as a female who was assigned female at birth, and is being seen today for about a week of URI symptoms starting with sore throat, nasal congestion and postnasal drip.  This stayed pretty consistent for a few days before acutely worsening over the weekend now with sinus pressure, sinus pain and ear pain.  Last night with sweats and overnight fever.  Denies any known exposures to COVID or influenza.  Denies any chest congestion.  Has history of sinus infections and this feels identical to previous episodes to her.  Has not taken a home COVID test.   OTC Medications -- Mucinex, Advil Cold and Sinus, Tylenol.   HPI: HPI  Problems:  Patient Active Problem List   Diagnosis Date Noted   Pregnancy 04/18/2021   Gestational hypertension 02/06/2017   NSVD (normal spontaneous vaginal delivery) 02/06/2017   Possible pregnancy, not confirmed 06/02/2016   Palpitations 07/10/2014   Melanoma of back ([facility]) 07/13/2012   Postop check 05/25/2012    Allergies:  Allergies  Allergen Reactions   Penicillins Hives    Childhood allergy Has patient had a PCN reaction causing immediate rash, facial/tongue/throat swelling, SOB or lightheadedness with hypotension: Yes Has patient had a PCN reaction causing  severe rash involving mucus membranes or skin necrosis: No Has patient had a PCN reaction that required hospitalization: No Has patient had a PCN reaction occurring within the last 10 years: No If all of the above answers are "NO", then may proceed with Cephalosporin use.    Medications:  Current Outpatient Medications:    doxycycline (VIBRA-TABS) 100 MG tablet, Take 1 tablet (100 mg  total) by mouth 2 (two) times daily., Disp: 14 tablet, Rfl: 0   fluticasone (FLONASE) 50 MCG/ACT nasal spray, Place 2 sprays into both nostrils daily., Disp: 16 g, Rfl: 0   levonorgestrel (MIRENA, 52 MG,) 20 MCG/DAY IUD, Provided by healthcare center, Disp: , Rfl:    mesalamine (LIALDA) 1.2 g EC tablet, Take 4 tablets (4.8 g total) by mouth daily., Disp: 120 tablet, Rfl: 5   omeprazole (PRILOSEC) 20 MG capsule, Take 20 mg by mouth 2 (two) times daily before a meal., Disp: , Rfl:    Prenatal Vit-Fe Fumarate-FA (PRENATAL MULTIVITAMIN) TABS tablet, Take 1 tablet by mouth daily at 12 noon., Disp: , Rfl:   Observations/Objective: Patient is well-developed, well-nourished in no acute distress.  Resting comfortably at home.  Head is normocephalic, atraumatic.  No labored breathing. Speech is clear and coherent with logical content.  Patient is alert and oriented at baseline.   Assessment and Plan: 1. Acute bacterial sinusitis - fluticasone (FLONASE) 50 MCG/ACT nasal spray; Place 2 sprays into both nostrils daily.  Dispense: 16 g; Refill: 0 - doxycycline (VIBRA-TABS) 100 MG tablet; Take 1 tablet (100 mg total) by mouth 2 (two) times daily.  Dispense: 14 tablet; Refill: 0  Rx Doxycycline due to penicillin allergy (no concern of pregnancy -IUD. Not breastfeeding).  Increase fluids.  Rest.  Saline nasal spray.  Probiotic.  Mucinex as directed.  Humidifier in bedroom. Flonase per orders.  Call or return to clinic if symptoms are not improving.   Follow Up Instructions: I discussed the assessment and treatment plan with the patient. The patient was provided an opportunity to ask questions and all were answered. The patient agreed with the plan and demonstrated an understanding of the instructions.  A copy of instructions were sent to the patient via MyChart unless otherwise noted below.   The patient was advised to call back or seek an in-person evaluation if the symptoms worsen or if the condition  fails to improve as anticipated.  Time:  I spent 10 minutes with the patient via telehealth technology discussing the above problems/concerns.    Madeline Rio, PA-C

## 2022-06-18 DIAGNOSIS — Z01419 Encounter for gynecological examination (general) (routine) without abnormal findings: Secondary | ICD-10-CM | POA: Diagnosis not present

## 2022-06-18 DIAGNOSIS — Z6825 Body mass index (BMI) 25.0-25.9, adult: Secondary | ICD-10-CM | POA: Diagnosis not present

## 2022-06-18 DIAGNOSIS — N3946 Mixed incontinence: Secondary | ICD-10-CM | POA: Diagnosis not present

## 2022-07-02 ENCOUNTER — Other Ambulatory Visit (HOSPITAL_COMMUNITY): Payer: Self-pay | Admitting: Gastroenterology

## 2022-07-02 ENCOUNTER — Encounter: Payer: Self-pay | Admitting: Gastroenterology

## 2022-07-02 DIAGNOSIS — R1012 Left upper quadrant pain: Secondary | ICD-10-CM | POA: Diagnosis not present

## 2022-07-02 DIAGNOSIS — Z8719 Personal history of other diseases of the digestive system: Secondary | ICD-10-CM | POA: Diagnosis not present

## 2022-07-08 ENCOUNTER — Other Ambulatory Visit: Payer: Self-pay

## 2022-07-18 ENCOUNTER — Ambulatory Visit (HOSPITAL_COMMUNITY): Payer: 59

## 2022-07-18 ENCOUNTER — Other Ambulatory Visit: Payer: Self-pay | Admitting: *Deleted

## 2022-07-21 ENCOUNTER — Ambulatory Visit (HOSPITAL_COMMUNITY)
Admission: RE | Admit: 2022-07-21 | Discharge: 2022-07-21 | Disposition: A | Discharge status: Home / Self Care | Payer: 59 | Source: Ambulatory Visit | Attending: Gastroenterology | Admitting: Gastroenterology

## 2022-07-21 DIAGNOSIS — R1012 Left upper quadrant pain: Secondary | ICD-10-CM | POA: Diagnosis not present

## 2022-07-21 DIAGNOSIS — K822 Perforation of gallbladder: Secondary | ICD-10-CM | POA: Diagnosis not present

## 2022-07-30 DIAGNOSIS — K31A12 Gastric intestinal metaplasia without dysplasia, involving the body (corpus): Secondary | ICD-10-CM | POA: Diagnosis not present

## 2022-07-30 DIAGNOSIS — K3189 Other diseases of stomach and duodenum: Secondary | ICD-10-CM | POA: Diagnosis not present

## 2022-07-30 DIAGNOSIS — R140 Abdominal distension (gaseous): Secondary | ICD-10-CM | POA: Diagnosis not present

## 2022-07-30 DIAGNOSIS — K2930 Chronic superficial gastritis without bleeding: Secondary | ICD-10-CM | POA: Diagnosis not present

## 2022-07-30 DIAGNOSIS — K2940 Chronic atrophic gastritis without bleeding: Secondary | ICD-10-CM | POA: Diagnosis not present

## 2022-07-30 DIAGNOSIS — R1012 Left upper quadrant pain: Secondary | ICD-10-CM | POA: Diagnosis not present

## 2022-08-12 DIAGNOSIS — D225 Melanocytic nevi of trunk: Secondary | ICD-10-CM | POA: Diagnosis not present

## 2022-08-12 DIAGNOSIS — Z872 Personal history of diseases of the skin and subcutaneous tissue: Secondary | ICD-10-CM | POA: Diagnosis not present

## 2022-08-12 DIAGNOSIS — L821 Other seborrheic keratosis: Secondary | ICD-10-CM | POA: Diagnosis not present

## 2022-08-12 DIAGNOSIS — Z85820 Personal history of malignant melanoma of skin: Secondary | ICD-10-CM | POA: Diagnosis not present

## 2022-08-12 DIAGNOSIS — D2372 Other benign neoplasm of skin of left lower limb, including hip: Secondary | ICD-10-CM | POA: Diagnosis not present

## 2022-08-12 DIAGNOSIS — D485 Neoplasm of uncertain behavior of skin: Secondary | ICD-10-CM | POA: Diagnosis not present

## 2022-08-12 DIAGNOSIS — Z08 Encounter for follow-up examination after completed treatment for malignant neoplasm: Secondary | ICD-10-CM | POA: Diagnosis not present

## 2022-08-12 DIAGNOSIS — L814 Other melanin hyperpigmentation: Secondary | ICD-10-CM | POA: Diagnosis not present

## 2022-09-23 ENCOUNTER — Other Ambulatory Visit (HOSPITAL_COMMUNITY): Payer: Self-pay

## 2022-09-23 ENCOUNTER — Telehealth: Payer: 59 | Admitting: Physician Assistant

## 2022-09-23 DIAGNOSIS — J208 Acute bronchitis due to other specified organisms: Secondary | ICD-10-CM | POA: Diagnosis not present

## 2022-09-23 DIAGNOSIS — J019 Acute sinusitis, unspecified: Secondary | ICD-10-CM

## 2022-09-23 DIAGNOSIS — B9689 Other specified bacterial agents as the cause of diseases classified elsewhere: Secondary | ICD-10-CM

## 2022-09-23 MED ORDER — PREDNISONE 10 MG (21) PO TBPK
ORAL_TABLET | ORAL | 0 refills | Status: AC
  Filled 2022-09-23: qty 21, 6d supply, fill #0

## 2022-09-23 MED ORDER — ALBUTEROL SULFATE HFA 108 (90 BASE) MCG/ACT IN AERS
1.0000 | INHALATION_SPRAY | Freq: Four times a day (QID) | RESPIRATORY_TRACT | 0 refills | Status: AC | PRN
  Filled 2022-09-23: qty 6.7, 25d supply, fill #0

## 2022-09-23 MED ORDER — DOXYCYCLINE HYCLATE 100 MG PO TABS
100.0000 mg | ORAL_TABLET | Freq: Two times a day (BID) | ORAL | 0 refills | Status: DC
  Filled 2022-09-23: qty 20, 10d supply, fill #0

## 2022-09-23 MED ORDER — PROMETHAZINE-DM 6.25-15 MG/5ML PO SYRP
5.0000 mL | ORAL_SOLUTION | Freq: Four times a day (QID) | ORAL | 0 refills | Status: AC | PRN
  Filled 2022-09-23: qty 118, 6d supply, fill #0

## 2022-09-23 NOTE — Progress Notes (Deleted)
Virtual Visit Consent   Madeline Perkins, you are scheduled for a virtual visit with a [facility] provider today. Just as with appointments in the office, your consent must be obtained to participate. Your consent will be active for this visit and any virtual visit you may have with one of our providers in the next 365 days. If you have a MyChart account, a copy of this consent can be sent to you electronically.  As this is a virtual visit, video technology does not allow for your provider to perform a traditional examination. This may limit your provider's ability to fully assess your condition. If your provider identifies any concerns that need to be evaluated in person or the need to arrange testing (such as labs, EKG, etc.), we will make arrangements to do so. Although advances in technology are sophisticated, we cannot ensure that it will always work on either your end or our end. If the connection with a video visit is poor, the visit may have to be switched to a telephone visit. With either a video or telephone visit, we are not always able to ensure that we have a secure connection.  By engaging in this virtual visit, you consent to the provision of healthcare and authorize for your insurance to be billed (if applicable) for the services provided during this visit. Depending on your insurance coverage, you may receive a charge related to this service.  I need to obtain your verbal consent now. Are you willing to proceed with your visit today? Madeline Perkins has provided verbal consent on 09/23/2022 for a virtual visit (video or telephone). [name], PA-C  Date: 09/23/2022 8:47 AM  Virtual Visit via Video Note   I, [name], connected with  Madeline Perkins  ([id number], [birth year]) on 09/23/22 at  8:30 AM EDT by a video-enabled telemedicine application and verified that I am speaking with the correct person using two identifiers.  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 The patient expressed understanding and agreed to proceed.    History of Present Illness: LANDREY BERNAL is a 37 y.o. who identifies as a female who was assigned female at birth, and is being seen today for possible sinus infection with cough.  HPI: Sinusitis This is a new problem. The current episode started 1 to 4 weeks ago. The problem has been gradually worsening since onset. There has been no fever. The pain is moderate. Associated symptoms include chills, congestion, coughing, ear pain (pressure only), headaches, sinus pressure and a sore throat. Pertinent negatives include no hoarse voice, shortness of breath or sneezing. (Chest tightness and wheezing) Treatments tried: advil cold and sinus, mucinex, mucinex sinus max, deslym. The treatment provided no relief.     Problems:  Patient Active Problem List   Diagnosis Date Noted   Pregnancy 04/18/2021   Gestational hypertension 02/06/2017   NSVD (normal spontaneous vaginal delivery) 02/06/2017   Possible pregnancy, not confirmed 06/02/2016   Palpitations 07/10/2014   Melanoma of back ([facility]) 07/13/2012   Postop check 05/25/2012    Allergies:  Allergies  Allergen Reactions   Penicillins Hives    Childhood allergy Has patient had a PCN reaction causing immediate rash, facial/tongue/throat swelling, SOB or lightheadedness with hypotension: Yes Has patient had a PCN reaction causing severe rash involving mucus membranes or  skin necrosis: No Has patient had a PCN reaction that required hospitalization: No Has patient had a PCN reaction occurring within the last 10 years: No If all of the above answers are "NO", then may proceed with Cephalosporin use.    Medications:  Current Outpatient Medications:    albuterol (VENTOLIN HFA)  108 (90 Base) MCG/ACT inhaler, Inhale 1-2 puffs into the lungs every 6 (six) hours as needed., Disp: 6.7 g, Rfl: 0   doxycycline (VIBRA-TABS) 100 MG tablet, Take 1 tablet (100 mg total) by mouth 2 (two) times daily., Disp: 20 tablet, Rfl: 0   predniSONE (STERAPRED UNI-PAK 21 TAB) 10 MG (21) TBPK tablet, 6 day taper; take as directed on package instructions, Disp: 21 tablet, Rfl: 0   promethazine-dextromethorphan (PROMETHAZINE-DM) 6.25-15 MG/5ML syrup, Take 5 mLs by mouth 4 (four) times daily as needed., Disp: 118 mL, Rfl: 0   fluticasone (FLONASE) 50 MCG/ACT nasal spray, Place 2 sprays into both nostrils daily., Disp: 16 g, Rfl: 0   levonorgestrel (MIRENA, 52 MG,) 20 MCG/DAY IUD, Provided by healthcare center, Disp: , Rfl:    mesalamine (LIALDA) 1.2 g EC tablet, Take 4 tablets (4.8 g total) by mouth daily., Disp: 120 tablet, Rfl: 5   omeprazole (PRILOSEC) 20 MG capsule, Take 20 mg by mouth 2 (two) times daily before a meal., Disp: , Rfl:    Prenatal Vit-Fe Fumarate-FA (PRENATAL MULTIVITAMIN) TABS tablet, Take 1 tablet by mouth daily at 12 noon., Disp: , Rfl:   Observations/Objective: Patient is well-developed, well-nourished in no acute distress.  Resting comfortably at home.  Head is normocephalic, atraumatic.  No labored breathing.  Speech is clear and coherent with logical content.  Patient is alert and oriented at baseline.    Assessment and Plan: 1. Acute bacterial sinusitis - doxycycline (VIBRA-TABS) 100 MG tablet; Take 1 tablet (100 mg total) by mouth 2 (two) times daily.  Dispense: 20 tablet; Refill: 0  2. Acute bacterial bronchitis - doxycycline (VIBRA-TABS) 100 MG tablet; Take 1 tablet (100 mg total) by mouth 2 (two) times daily.  Dispense: 20 tablet; Refill: 0 - predniSONE (STERAPRED UNI-PAK 21 TAB) 10 MG (21) TBPK tablet; 6 day taper; take as directed on package instructions  Dispense: 21 tablet; Refill: 0 - albuterol (VENTOLIN HFA) 108 (90 Base) MCG/ACT inhaler; Inhale 1-2  puffs into the lungs every 6 (six) hours as needed.  Dispense: 6.7 g; Refill: 0 - promethazine-dextromethorphan (PROMETHAZINE-DM) 6.25-15 MG/5ML syrup; Take 5 mLs by mouth 4 (four) times daily as needed.  Dispense: 118 mL; Refill: 0  - Worsening symptoms that have not responded to OTC medications.  - Will give Doxycycline, Prednisone, Albuterol, and Promethazine DM - Continue allergy medications.  - Steam and humidifier can help - Stay well hydrated and get plenty of rest.  - Seek in person evaluation if no symptom improvement or if symptoms worsen   Follow Up Instructions: I discussed the assessment and treatment plan with the patient. The patient was provided an opportunity to ask questions and all were answered. The patient agreed with the plan and demonstrated an understanding of the instructions.  A copy of instructions were sent to the patient via MyChart unless otherwise noted below.    The patient was advised to call back or seek an in-person evaluation if the symptoms worsen or if the condition fails to improve as anticipated.  Time:  I spent 8 minutes with the patient via telehealth technology discussing the above problems/concerns.    [name], PA-C

## 2022-09-23 NOTE — Progress Notes (Signed)
Virtual Visit Consent   Madeline Perkins, you are scheduled for a virtual visit with a [facility] provider today. Just as with appointments in the office, your consent must be obtained to participate. Your consent will be active for this visit and any virtual visit you may have with one of our providers in the next 365 days. If you have a MyChart account, a copy of this consent can be sent to you electronically.  As this is a virtual visit, video technology does not allow for your provider to perform a traditional examination. This may limit your provider's ability to fully assess your condition. If your provider identifies any concerns that need to be evaluated in person or the need to arrange testing (such as labs, EKG, etc.), we will make arrangements to do so. Although advances in technology are sophisticated, we cannot ensure that it will always work on either your end or our end. If the connection with a video visit is poor, the visit may have to be switched to a telephone visit. With either a video or telephone visit, we are not always able to ensure that we have a secure connection.  By engaging in this virtual visit, you consent to the provision of healthcare and authorize for your insurance to be billed (if applicable) for the services provided during this visit. Depending on your insurance coverage, you may receive a charge related to this service.  I need to obtain your verbal consent now. Are you willing to proceed with your visit today? Madeline Perkins has provided verbal consent on 09/23/2022 for a virtual visit (video or telephone). Madeline Loveless, PA-C  Date: 09/23/2022 9:00 AM  Virtual Visit via Video Note   I, Madeline Perkins, connected with  Madeline Perkins  ([id number], [birth year]) on 09/23/22 at  8:30 AM EDT by a video-enabled telemedicine application and verified that I am speaking with the correct person using two identifiers.  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 The patient expressed understanding and agreed to proceed.    History of Present Illness: Madeline Perkins is a 37 y.o. who identifies as a female who was assigned female at birth, and is being seen today for possible sinus infection with cough.  HPI: Sinusitis This is a new problem. The current episode started 1 to 4 weeks ago. The problem has been gradually worsening since onset. There has been no fever. The pain is moderate. Associated symptoms include chills, congestion, coughing, ear pain (pressure only), headaches, sinus pressure and a sore throat. Pertinent negatives include no hoarse voice, shortness of breath or sneezing. (Chest tightness and wheezing) Treatments tried: advil cold and sinus, mucinex, mucinex sinus max, deslym. The treatment provided no relief.     Problems:  Patient Active Problem List   Diagnosis Date Noted   Pregnancy 04/18/2021   Gestational hypertension 02/06/2017   NSVD (normal spontaneous vaginal delivery) 02/06/2017   Possible pregnancy, not confirmed 06/02/2016   Palpitations 07/10/2014   Melanoma of back ([facility]) 07/13/2012   Postop check 05/25/2012    Allergies:  Allergies  Allergen Reactions   Penicillins Hives    Childhood allergy Has patient had a PCN reaction causing immediate rash, facial/tongue/throat swelling, SOB or lightheadedness with hypotension: Yes Has patient had a PCN reaction causing severe rash involving mucus membranes or  skin necrosis: No Has patient had a PCN reaction that required hospitalization: No Has patient had a PCN reaction occurring within the last 10 years: No If all of the above answers are "NO", then may proceed with Cephalosporin use.    Medications:  Current Outpatient Medications:    albuterol (VENTOLIN HFA)  108 (90 Base) MCG/ACT inhaler, Inhale 1-2 puffs into the lungs every 6 (six) hours as needed., Disp: 6.7 g, Rfl: 0   doxycycline (VIBRA-TABS) 100 MG tablet, Take 1 tablet (100 mg total) by mouth 2 (two) times daily., Disp: 20 tablet, Rfl: 0   predniSONE (STERAPRED UNI-PAK 21 TAB) 10 MG (21) TBPK tablet, 6 day taper; take as directed on package instructions, Disp: 21 tablet, Rfl: 0   promethazine-dextromethorphan (PROMETHAZINE-DM) 6.25-15 MG/5ML syrup, Take 5 mLs by mouth 4 (four) times daily as needed., Disp: 118 mL, Rfl: 0   fluticasone (FLONASE) 50 MCG/ACT nasal spray, Place 2 sprays into both nostrils daily., Disp: 16 g, Rfl: 0   levonorgestrel (MIRENA, 52 MG,) 20 MCG/DAY IUD, Provided by healthcare center, Disp: , Rfl:    mesalamine (LIALDA) 1.2 g EC tablet, Take 4 tablets (4.8 g total) by mouth daily., Disp: 120 tablet, Rfl: 5   omeprazole (PRILOSEC) 20 MG capsule, Take 20 mg by mouth 2 (two) times daily before a meal., Disp: , Rfl:    Prenatal Vit-Fe Fumarate-FA (PRENATAL MULTIVITAMIN) TABS tablet, Take 1 tablet by mouth daily at 12 noon., Disp: , Rfl:   Observations/Objective: Patient is well-developed, well-nourished in no acute distress.  Resting comfortably at home.  Head is normocephalic, atraumatic.  No labored breathing.  Speech is clear and coherent with logical content.  Patient is alert and oriented at baseline.    Assessment and Plan: 1. Acute bacterial sinusitis - doxycycline (VIBRA-TABS) 100 MG tablet; Take 1 tablet (100 mg total) by mouth 2 (two) times daily.  Dispense: 20 tablet; Refill: 0  2. Acute bacterial bronchitis - doxycycline (VIBRA-TABS) 100 MG tablet; Take 1 tablet (100 mg total) by mouth 2 (two) times daily.  Dispense: 20 tablet; Refill: 0 - predniSONE (STERAPRED UNI-PAK 21 TAB) 10 MG (21) TBPK tablet; 6 day taper; take as directed on package instructions  Dispense: 21 tablet; Refill: 0 - albuterol (VENTOLIN HFA) 108 (90 Base) MCG/ACT inhaler; Inhale 1-2  puffs into the lungs every 6 (six) hours as needed.  Dispense: 6.7 g; Refill: 0 - promethazine-dextromethorphan (PROMETHAZINE-DM) 6.25-15 MG/5ML syrup; Take 5 mLs by mouth 4 (four) times daily as needed.  Dispense: 118 mL; Refill: 0  - Worsening symptoms that have not responded to OTC medications.  - Will give Doxycycline, Prednisone, Albuterol, and Promethazine DM - Continue allergy medications.  - Steam and humidifier can help - Stay well hydrated and get plenty of rest.  - Seek in person evaluation if no symptom improvement or if symptoms worsen   Follow Up Instructions: I discussed the assessment and treatment plan with the patient. The patient was provided an opportunity to ask questions and all were answered. The patient agreed with the plan and demonstrated an understanding of the instructions.  A copy of instructions were sent to the patient via MyChart unless otherwise noted below.    The patient was advised to call back or seek an in-person evaluation if the symptoms worsen or if the condition fails to improve as anticipated.  Time:  I spent 8 minutes with the patient via telehealth technology discussing the above problems/concerns.    Madeline Loveless, PA-C

## 2022-09-23 NOTE — Patient Instructions (Signed)
Madeline Perkins, thank you for joining [name], PA-C for today's virtual visit.  While this provider is not your primary care provider (PCP), if your PCP is located in our provider database this encounter information will be shared with them immediately following your visit.   A [facility] MyChart account gives you access to today's visit and all your visits, tests, and labs performed at [facility] " click here if you don't have a [facility] MyChart account or go to [email or web address]  Consent: (Patient) Madeline Perkins provided verbal consent for this virtual visit at the beginning of the encounter.  Current Medications:  Current Outpatient Medications:    albuterol (VENTOLIN HFA) 108 (90 Base) MCG/ACT inhaler, Inhale 1-2 puffs into the lungs every 6 (six) hours as needed., Disp: 8 g, Rfl: 0   doxycycline (VIBRA-TABS) 100 MG tablet, Take 1 tablet (100 mg total) by mouth 2 (two) times daily., Disp: 20 tablet, Rfl: 0   predniSONE (STERAPRED UNI-PAK 21 TAB) 10 MG (21) TBPK tablet, 6 day taper; take as directed on package instructions, Disp: 21 tablet, Rfl: 0   promethazine-dextromethorphan (PROMETHAZINE-DM) 6.25-15 MG/5ML syrup, Take 5 mLs by mouth 4 (four) times daily as needed., Disp: 118 mL, Rfl: 0   fluticasone (FLONASE) 50 MCG/ACT nasal spray, Place 2 sprays into both nostrils daily., Disp: 16 g, Rfl: 0   levonorgestrel (MIRENA, 52 MG,) 20 MCG/DAY IUD, Provided by healthcare center, Disp: , Rfl:    mesalamine (LIALDA) 1.2 g EC tablet, Take 4 tablets (4.8 g total) by mouth daily., Disp: 120 tablet, Rfl: 5   omeprazole (PRILOSEC) 20 MG capsule, Take 20 mg by mouth 2 (two) times daily before a meal., Disp: , Rfl:    Prenatal Vit-Fe Fumarate-FA (PRENATAL MULTIVITAMIN) TABS tablet, Take 1 tablet by mouth daily at 12 noon., Disp: , Rfl:    Medications ordered in this encounter:  Meds ordered this encounter  Medications   doxycycline (VIBRA-TABS) 100 MG tablet     Sig: Take 1 tablet (100 mg total) by mouth 2 (two) times daily.    Dispense:  20 tablet    Refill:  0    Order Specific Question:   Supervising Provider    Answer:   Merrilee Jansky [[id number]]   predniSONE (STERAPRED UNI-PAK 21 TAB) 10 MG (21) TBPK tablet    Sig: 6 day taper; take as directed on package instructions    Dispense:  21 tablet    Refill:  0    Order Specific Question:   Supervising Provider    Answer:   Merrilee Jansky [[id number]]   albuterol (VENTOLIN HFA) 108 (90 Base) MCG/ACT inhaler    Sig: Inhale 1-2 puffs into the lungs every 6 (six) hours as needed.    Dispense:  8 g    Refill:  0    Order Specific Question:   Supervising Provider    Answer:   Merrilee Jansky [id number]   promethazine-dextromethorphan (PROMETHAZINE-DM) 6.25-15 MG/5ML syrup    Sig: Take 5 mLs by mouth 4 (four) times daily as needed.    Dispense:  118 mL    Refill:  0    Order Specific Question:   Supervising Provider    Answer:   Merrilee Jansky [[id number]]     *If you need refills on other medications prior to your next appointment, please contact your pharmacy*  Follow-Up: Call back or seek an in-person evaluation if the symptoms worsen or if the condition fails to  improve as anticipated.  [facility] [phone]  Other Instructions  Acute Bronchitis, Adult  Acute bronchitis is sudden inflammation of the main airways (bronchi) that come off the windpipe (trachea) in the lungs. The swelling causes the airways to get smaller and make more mucus than normal. This can make it hard to breathe and can cause coughing or noisy breathing (wheezing). Acute bronchitis may last several weeks. The cough may last longer. Allergies, asthma, and exposure to smoke may make the condition worse. What are the causes? This condition can be caused by germs and by substances that irritate the lungs, including: Cold and flu viruses. The most common cause of this condition is the virus that  causes the common cold. Bacteria. This is less common. Breathing in substances that irritate the lungs, including: Smoke from cigarettes and other forms of tobacco. Dust and pollen. Fumes from household cleaning products, gases, or burned fuel. Indoor or outdoor air pollution. What increases the risk? The following factors may make you more likely to develop this condition: A weak body's defense system, also called the immune system. A condition that affects your lungs and breathing, such as asthma. What are the signs or symptoms? Common symptoms of this condition include: Coughing. This may bring up clear, yellow, or green mucus from your lungs (sputum). Wheezing. Runny or stuffy nose. Having too much mucus in your lungs (chest congestion). Shortness of breath. Aches and pains, including sore throat or chest. How is this diagnosed? This condition is usually diagnosed based on: Your symptoms and medical history. A physical exam. You may also have other tests, including tests to rule out other conditions, such as pneumonia. These tests include: A test of lung function. Test of a mucus sample to look for the presence of bacteria. Tests to check the oxygen level in your blood. Blood tests. Chest X-ray. How is this treated? Most cases of acute bronchitis clear up over time without treatment. Your health care provider may recommend: Drinking more fluids to help thin your mucus so it is easier to cough up. Taking inhaled medicine (inhaler) to improve air flow in and out of your lungs. Using a vaporizer or a humidifier. These are machines that add water to the air to help you breathe better. Taking a medicine that thins mucus and clears congestion (expectorant). Taking a medicine that prevents or stops coughing (cough suppressant). It is not common to take an antibiotic medicine for this condition. Follow these instructions at home:  Take over-the-counter and prescription medicines  only as told by your health care provider. Use an inhaler, vaporizer, or humidifier as told by your health care provider. Take two teaspoons (10 mL) of honey at bedtime to lessen coughing at night. Drink enough fluid to keep your urine pale yellow. Do not use any products that contain nicotine or tobacco. These products include cigarettes, chewing tobacco, and vaping devices, such as e-cigarettes. If you need help quitting, ask your health care provider. Get plenty of rest. Return to your normal activities as told by your health care provider. Ask your health care provider what activities are safe for you. Keep all follow-up visits. This is important. How is this prevented? To lower your risk of getting this condition again: Wash your hands often with soap and water for at least 20 seconds. If soap and water are not available, use hand sanitizer. Avoid contact with people who have cold symptoms. Try not to touch your mouth, nose, or eyes with your  hands. Avoid breathing in smoke or chemical fumes. Breathing smoke or chemical fumes will make your condition worse. Get the flu shot every year. Contact a health care provider if: Your symptoms do not improve after 2 weeks. You have trouble coughing up the mucus. Your cough keeps you awake at night. You have a fever. Get help right away if you: Cough up blood. Feel pain in your chest. Have severe shortness of breath. Faint or keep feeling like you are going to faint. Have a severe headache. Have a fever or chills that get worse. These symptoms may represent a serious problem that is an emergency. Do not wait to see if the symptoms will go away. Get medical help right away. Call your local emergency services (911 in the [place] Do not drive yourself to the hospital. Summary Acute bronchitis is inflammation of the main airways (bronchi) that come off the windpipe (trachea) in the lungs. The swelling causes the airways to get smaller and make more  mucus than normal. Drinking more fluids can help thin your mucus so it is easier to cough up. Take over-the-counter and prescription medicines only as told by your health care provider. Do not use any products that contain nicotine or tobacco. These products include cigarettes, chewing tobacco, and vaping devices, such as e-cigarettes. If you need help quitting, ask your health care provider. Contact a health care provider if your symptoms do not improve after 2 weeks. This information is not intended to replace advice given to you by your health care provider. Make sure you discuss any questions you have with your health care provider. Document Revised: 07/18/2021 Document Reviewed: 08/08/2020 Elsevier Patient Education  2024 Elsevier Inc.   Sinus Infection, Adult A sinus infection is soreness and swelling (inflammation) of your sinuses. Sinuses are hollow spaces in the bones around your face. They are located: Around your eyes. In the middle of your forehead. Behind your nose. In your cheekbones. Your sinuses and nasal passages are lined with a fluid called mucus. Mucus drains out of your sinuses. Swelling can trap mucus in your sinuses. This lets germs (bacteria, virus, or fungus) grow, which leads to infection. Most of the time, this condition is caused by a virus. What are the causes? Allergies. Asthma. Germs. Things that block your nose or sinuses. Growths in the nose (nasal polyps). Chemicals or irritants in the air. A fungus. This is rare. What increases the risk? Having a weak body defense system (immune system). Doing a lot of swimming or diving. Using nasal sprays too much. Smoking. What are the signs or symptoms? The main symptoms of this condition are pain and a feeling of pressure around the sinuses. Other symptoms include: Stuffy nose (congestion). This may make it hard to breathe through your nose. Runny nose (drainage). Soreness, swelling, and warmth in the  sinuses. A cough that may get worse at night. Being unable to smell and taste. Mucus that collects in the throat or the back of the nose (postnasal drip). This may cause a sore throat or bad breath. Being very tired (fatigued). A fever. How is this diagnosed? Your symptoms. Your medical history. A physical exam. Tests to find out if your condition is short-term (acute) or long-term (chronic). Your doctor may: Check your nose for growths (polyps). Check your sinuses using a tool that has a light on one end (endoscope). Check for allergies or germs. Do imaging tests, such as an MRI or CT scan. How is this treated? Treatment for this  condition depends on the cause and whether it is short-term or long-term. If caused by a virus, your symptoms should go away on their own within 10 days. You may be given medicines to relieve symptoms. They include: Medicines that shrink swollen tissue in the nose. A spray that treats swelling of the nostrils. Rinses that help get rid of thick mucus in your nose (nasal saline washes). Medicines that treat allergies (antihistamines). Over-the-counter pain relievers. If caused by bacteria, your doctor may wait to see if you will get better without treatment. You may be given antibiotic medicine if you have: A very bad infection. A weak body defense system. If caused by growths in the nose, surgery may be needed. Follow these instructions at home: Medicines Take, use, or apply over-the-counter and prescription medicines only as told by your doctor. These may include nasal sprays. If you were prescribed an antibiotic medicine, take it as told by your doctor. Do not stop taking it even if you start to feel better. Hydrate and humidify  Drink enough water to keep your pee (urine) pale yellow. Use a cool mist humidifier to keep the humidity level in your home above 50%. Breathe in steam for 10-15 minutes, 3-4 times a day, or as told by your doctor. You can do  this in the bathroom while a hot shower is running. Try not to spend time in cool or dry air. Rest Rest as much as you can. Sleep with your head raised (elevated). Make sure you get enough sleep each night. General instructions  Put a warm, moist washcloth on your face 3-4 times a day, or as often as told by your doctor. Use nasal saline washes as often as told by your doctor. Wash your hands often with soap and water. If you cannot use soap and water, use hand sanitizer. Do not smoke. Avoid being around people who are smoking (secondhand smoke). Keep all follow-up visits. Contact a doctor if: You have a fever. Your symptoms get worse. Your symptoms do not get better within 10 days. Get help right away if: You have a very bad headache. You cannot stop vomiting. You have very bad pain or swelling around your face or eyes. You have trouble seeing. You feel confused. Your neck is stiff. You have trouble breathing. These symptoms may be an emergency. Get help right away. Call 911. Do not wait to see if the symptoms will go away. Do not drive yourself to the hospital. Summary A sinus infection is swelling of your sinuses. Sinuses are hollow spaces in the bones around your face. This condition is caused by tissues in your nose that become inflamed or swollen. This traps germs. These can lead to infection. If you were prescribed an antibiotic medicine, take it as told by your doctor. Do not stop taking it even if you start to feel better. Keep all follow-up visits. This information is not intended to replace advice given to you by your health care provider. Make sure you discuss any questions you have with your health care provider. Document Revised: 03/12/2021 Document Reviewed: 03/12/2021 Elsevier Patient Education  2024 Elsevier Inc.    If you have been instructed to have an in-person evaluation today at a local Urgent Care facility, please use the link below. It will take you to  a list of all of our available [facility], including address, phone number and hours of operation. Please do not delay care.  [facility]  If you  or a family member do not have a primary care provider, use the link below to schedule a visit and establish care. When you choose a [facility] primary care physician or advanced practice provider, you gain a long-term partner in health. Find a Primary Care Provider  Learn more about [facility]'s in-office and virtual care options: [facility] Get [facility] Now

## 2022-09-29 ENCOUNTER — Other Ambulatory Visit (HOSPITAL_COMMUNITY): Payer: Self-pay

## 2022-09-30 ENCOUNTER — Other Ambulatory Visit (HOSPITAL_COMMUNITY): Payer: Self-pay

## 2022-09-30 MED ORDER — OXYBUTYNIN CHLORIDE ER 10 MG PO TB24
10.0000 mg | ORAL_TABLET | Freq: Every day | ORAL | 3 refills | Status: AC
  Filled 2022-09-30: qty 90, 90d supply, fill #0
  Filled 2023-01-06: qty 90, 90d supply, fill #1

## 2022-10-21 ENCOUNTER — Other Ambulatory Visit (HOSPITAL_COMMUNITY): Payer: Self-pay

## 2022-10-27 ENCOUNTER — Other Ambulatory Visit (HOSPITAL_COMMUNITY): Payer: Self-pay

## 2022-10-28 ENCOUNTER — Other Ambulatory Visit (HOSPITAL_COMMUNITY): Payer: Self-pay

## 2022-10-28 DIAGNOSIS — K219 Gastro-esophageal reflux disease without esophagitis: Secondary | ICD-10-CM | POA: Diagnosis not present

## 2022-10-28 MED ORDER — PANTOPRAZOLE SODIUM 40 MG PO TBEC
40.0000 mg | DELAYED_RELEASE_TABLET | Freq: Every day | ORAL | 2 refills | Status: AC
  Filled 2022-10-28: qty 30, 30d supply, fill #0

## 2022-10-29 ENCOUNTER — Other Ambulatory Visit (HOSPITAL_COMMUNITY): Payer: Self-pay

## 2022-10-29 MED ORDER — ONDANSETRON 4 MG PO TBDP
8.0000 mg | ORAL_TABLET | Freq: Two times a day (BID) | ORAL | 0 refills | Status: AC | PRN
  Filled 2022-10-29: qty 120, 30d supply, fill #0

## 2022-10-29 MED ORDER — MESALAMINE 1.2 G PO TBEC
4.8000 g | DELAYED_RELEASE_TABLET | Freq: Every day | ORAL | 6 refills | Status: AC
  Filled 2022-10-29: qty 120, 30d supply, fill #0
  Filled 2022-12-07: qty 120, 30d supply, fill #1
  Filled 2023-05-14 – 2023-06-01 (×2): qty 120, 30d supply, fill #2
  Filled 2023-08-10: qty 120, 30d supply, fill #3
  Filled 2023-09-17: qty 120, 30d supply, fill #4

## 2022-11-03 ENCOUNTER — Other Ambulatory Visit: Payer: Self-pay | Admitting: Gastroenterology

## 2022-11-03 ENCOUNTER — Other Ambulatory Visit (HOSPITAL_COMMUNITY): Payer: Self-pay

## 2022-11-03 DIAGNOSIS — Z8719 Personal history of other diseases of the digestive system: Secondary | ICD-10-CM | POA: Diagnosis not present

## 2022-11-03 DIAGNOSIS — R634 Abnormal weight loss: Secondary | ICD-10-CM

## 2022-11-03 DIAGNOSIS — R1013 Epigastric pain: Secondary | ICD-10-CM

## 2022-11-03 DIAGNOSIS — K31A0 Gastric intestinal metaplasia, unspecified: Secondary | ICD-10-CM | POA: Diagnosis not present

## 2022-11-03 DIAGNOSIS — R197 Diarrhea, unspecified: Secondary | ICD-10-CM | POA: Diagnosis not present

## 2022-11-03 MED ORDER — SUCRALFATE 1 G PO TABS
1.0000 g | ORAL_TABLET | Freq: Two times a day (BID) | ORAL | 1 refills | Status: DC
  Filled 2022-11-03: qty 60, 30d supply, fill #0
  Filled 2022-11-29: qty 60, 30d supply, fill #1

## 2022-11-04 ENCOUNTER — Other Ambulatory Visit (HOSPITAL_COMMUNITY): Payer: Self-pay

## 2022-11-04 MED ORDER — PANTOPRAZOLE SODIUM 40 MG PO TBEC
40.0000 mg | DELAYED_RELEASE_TABLET | Freq: Two times a day (BID) | ORAL | 11 refills | Status: AC
  Filled 2022-11-04 – 2022-11-12 (×2): qty 60, 30d supply, fill #0
  Filled 2022-12-07: qty 60, 30d supply, fill #1
  Filled 2023-01-10: qty 60, 30d supply, fill #2

## 2022-11-05 DIAGNOSIS — Z8719 Personal history of other diseases of the digestive system: Secondary | ICD-10-CM | POA: Diagnosis not present

## 2022-11-06 ENCOUNTER — Other Ambulatory Visit: Payer: 59

## 2022-11-06 DIAGNOSIS — R197 Diarrhea, unspecified: Secondary | ICD-10-CM | POA: Diagnosis not present

## 2022-11-06 DIAGNOSIS — R1013 Epigastric pain: Secondary | ICD-10-CM | POA: Diagnosis not present

## 2022-11-06 DIAGNOSIS — R634 Abnormal weight loss: Secondary | ICD-10-CM

## 2022-11-06 DIAGNOSIS — Z8719 Personal history of other diseases of the digestive system: Secondary | ICD-10-CM | POA: Diagnosis not present

## 2022-11-06 MED ORDER — IOPAMIDOL (ISOVUE-300) INJECTION 61%
100.0000 mL | Freq: Once | INTRAVENOUS | Status: AC | PRN
  Administered 2022-11-06: 100 mL via INTRAVENOUS

## 2022-11-07 ENCOUNTER — Other Ambulatory Visit: Payer: 59

## 2022-11-12 ENCOUNTER — Other Ambulatory Visit (HOSPITAL_COMMUNITY): Payer: Self-pay

## 2022-12-16 ENCOUNTER — Other Ambulatory Visit (HOSPITAL_COMMUNITY): Payer: Self-pay

## 2023-01-06 ENCOUNTER — Other Ambulatory Visit (HOSPITAL_COMMUNITY): Payer: Self-pay

## 2023-01-07 ENCOUNTER — Other Ambulatory Visit (HOSPITAL_COMMUNITY): Payer: Self-pay

## 2023-01-07 ENCOUNTER — Encounter (HOSPITAL_COMMUNITY): Payer: Self-pay

## 2023-01-07 MED ORDER — SUCRALFATE 1 G PO TABS
1.0000 g | ORAL_TABLET | Freq: Two times a day (BID) | ORAL | 1 refills | Status: AC
  Filled 2023-01-07: qty 60, 30d supply, fill #0

## 2023-01-13 DIAGNOSIS — K5190 Ulcerative colitis, unspecified, without complications: Secondary | ICD-10-CM | POA: Diagnosis not present

## 2023-01-13 DIAGNOSIS — K31A0 Gastric intestinal metaplasia, unspecified: Secondary | ICD-10-CM | POA: Diagnosis not present

## 2023-01-13 DIAGNOSIS — K2960 Other gastritis without bleeding: Secondary | ICD-10-CM | POA: Diagnosis not present

## 2023-01-29 DIAGNOSIS — N644 Mastodynia: Secondary | ICD-10-CM | POA: Diagnosis not present

## 2023-02-03 ENCOUNTER — Telehealth: Payer: 59 | Admitting: Physician Assistant

## 2023-02-03 ENCOUNTER — Telehealth: Payer: 59

## 2023-02-03 DIAGNOSIS — B9689 Other specified bacterial agents as the cause of diseases classified elsewhere: Secondary | ICD-10-CM

## 2023-02-03 DIAGNOSIS — J019 Acute sinusitis, unspecified: Secondary | ICD-10-CM

## 2023-02-03 MED ORDER — DOXYCYCLINE HYCLATE 100 MG PO TABS
100.0000 mg | ORAL_TABLET | Freq: Two times a day (BID) | ORAL | 0 refills | Status: AC
Start: 2023-02-03 — End: ?

## 2023-02-03 NOTE — Patient Instructions (Signed)
Aleatha Borer, thank you for joining [name], PA-C for today's virtual visit.  While this provider is not your primary care provider (PCP), if your PCP is located in our provider database this encounter information will be shared with them immediately following your visit.   A [facility] MyChart account gives you access to today's visit and all your visits, tests, and labs performed at [facility] " click here if you don't have a [facility] MyChart account or go to [email or web address]  Consent: (Patient) Aleatha Borer provided verbal consent for this virtual visit at the beginning of the encounter.  Current Medications:  Current Outpatient Medications:    doxycycline (VIBRA-TABS) 100 MG tablet, Take 1 tablet (100 mg total) by mouth 2 (two) times daily., Disp: 20 tablet, Rfl: 0   albuterol (VENTOLIN HFA) 108 (90 Base) MCG/ACT inhaler, Inhale 1-2 puffs into the lungs every 6 (six) hours as needed., Disp: 6.7 g, Rfl: 0   fluticasone (FLONASE) 50 MCG/ACT nasal spray, Place 2 sprays into both nostrils daily., Disp: 16 g, Rfl: 0   levonorgestrel (MIRENA, 52 MG,) 20 MCG/DAY IUD, Provided by healthcare center, Disp: , Rfl:    mesalamine (LIALDA) 1.2 g EC tablet, Take 4 tablets (4.8 g total) by mouth daily., Disp: 120 tablet, Rfl: 5   mesalamine (LIALDA) 1.2 g EC tablet, Take 4 tablets (4.8 g total) by mouth daily., Disp: 120 tablet, Rfl: 6   omeprazole (PRILOSEC) 20 MG capsule, Take 20 mg by mouth 2 (two) times daily before a meal., Disp: , Rfl:    ondansetron (ZOFRAN-ODT) 4 MG disintegrating tablet, dissolve  2 tablets (8 mg total) in mouth 2 (two) times daily as needed., Disp: 120 tablet, Rfl: 0   oxybutynin (DITROPAN-XL) 10 MG 24 hr tablet, Take 1 tablet (10 mg total) by mouth daily., Disp: 90 tablet, Rfl: 3   pantoprazole (PROTONIX) 40 MG tablet, Take 1 tablet (40 mg total) by mouth daily., Disp: 30 tablet, Rfl: 2   pantoprazole (PROTONIX) 40 MG tablet, Take 1  tablet (40 mg total) by mouth 2 (two) times daily., Disp: 60 tablet, Rfl: 11   predniSONE (STERAPRED UNI-PAK 21 TAB) 10 MG (21) TBPK tablet, 6 day taper; take as directed on package instructions, Disp: 21 tablet, Rfl: 0   Prenatal Vit-Fe Fumarate-FA (PRENATAL MULTIVITAMIN) TABS tablet, Take 1 tablet by mouth daily at 12 noon., Disp: , Rfl:    promethazine-dextromethorphan (PROMETHAZINE-DM) 6.25-15 MG/5ML syrup, Take 5 mLs by mouth 4 (four) times daily as needed., Disp: 118 mL, Rfl: 0   sucralfate (CARAFATE) 1 g tablet, Take 1 tablet (1 g total) by mouth twice daily on an empty stomach., Disp: 60 tablet, Rfl: 1   Medications ordered in this encounter:  Meds ordered this encounter  Medications   doxycycline (VIBRA-TABS) 100 MG tablet    Sig: Take 1 tablet (100 mg total) by mouth 2 (two) times daily.    Dispense:  20 tablet    Refill:  0    Order Specific Question:   Supervising Provider    Answer:   Merrilee Jansky [id number]     *If you need refills on other medications prior to your next appointment, please contact your pharmacy*  Follow-Up: Call back or seek an in-person evaluation if the symptoms worsen or if the condition fails to improve as anticipated.  [facility] [phone]  Other Instructions Sinus Infection, Adult A sinus infection, also called sinusitis, is inflammation of your sinuses. Sinuses  are hollow spaces in the bones around your face. Your sinuses are located: Around your eyes. In the middle of your forehead. Behind your nose. In your cheekbones. Mucus normally drains out of your sinuses. When your nasal tissues become inflamed or swollen, mucus can become trapped or blocked. This allows bacteria, viruses, and fungi to grow, which leads to infection. Most infections of the sinuses are caused by a virus. A sinus infection can develop quickly. It can last for up to 4 weeks (acute) or for more than 12 weeks (chronic). A sinus infection often develops  after a cold. What are the causes? This condition is caused by anything that creates swelling in the sinuses or stops mucus from draining. This includes: Allergies. Asthma. Infection from bacteria or viruses. Deformities or blockages in your nose or sinuses. Abnormal growths in the nose (nasal polyps). Pollutants, such as chemicals or irritants in the air. Infection from fungi. This is rare. What increases the risk? You are more likely to develop this condition if you: Have a weak body defense system (immune system). Do a lot of swimming or diving. Overuse nasal sprays. Smoke. What are the signs or symptoms? The main symptoms of this condition are pain and a feeling of pressure around the affected sinuses. Other symptoms include: Stuffy nose or congestion that makes it difficult to breathe through your nose. Thick yellow or greenish drainage from your nose. Tenderness, swelling, and warmth over the affected sinuses. A cough that may get worse at night. Decreased sense of smell and taste. Extra mucus that collects in the throat or the back of the nose (postnasal drip) causing a sore throat or bad breath. Tiredness (fatigue). Fever. How is this diagnosed? This condition is diagnosed based on: Your symptoms. Your medical history. A physical exam. Tests to find out if your condition is acute or chronic. This may include: Checking your nose for nasal polyps. Viewing your sinuses using a device that has a light (endoscope). Testing for allergies or bacteria. Imaging tests, such as an MRI or CT scan. In rare cases, a bone biopsy may be done to rule out more serious types of fungal sinus disease. How is this treated? Treatment for a sinus infection depends on the cause and whether your condition is chronic or acute. If caused by a virus, your symptoms should go away on their own within 10 days. You may be given medicines to relieve symptoms. They include: Medicines that shrink swollen  nasal passages (decongestants). A spray that eases inflammation of the nostrils (topical intranasal corticosteroids). Rinses that help get rid of thick mucus in your nose (nasal saline washes). Medicines that treat allergies (antihistamines). Over-the-counter pain relievers. If caused by bacteria, your health care provider may recommend waiting to see if your symptoms improve. Most bacterial infections will get better without antibiotic medicine. You may be given antibiotics if you have: A severe infection. A weak immune system. If caused by narrow nasal passages or nasal polyps, surgery may be needed. Follow these instructions at home: Medicines Take, use, or apply over-the-counter and prescription medicines only as told by your health care provider. These may include nasal sprays. If you were prescribed an antibiotic medicine, take it as told by your health care provider. Do not stop taking the antibiotic even if you start to feel better. Hydrate and humidify  Drink enough fluid to keep your urine pale yellow. Staying hydrated will help to thin your mucus. Use a cool mist humidifier to keep the humidity level  in your home above 50%. Inhale steam for 10-15 minutes, 3-4 times a day, or as told by your health care provider. You can do this in the bathroom while a hot shower is running. Limit your exposure to cool or dry air. Rest Rest as much as possible. Sleep with your head raised (elevated). Make sure you get enough sleep each night. General instructions  Apply a warm, moist washcloth to your face 3-4 times a day or as told by your health care provider. This will help with discomfort. Use nasal saline washes as often as told by your health care provider. Wash your hands often with soap and water to reduce your exposure to germs. If soap and water are not available, use hand sanitizer. Do not smoke. Avoid being around people who are smoking (secondhand smoke). Keep all follow-up visits.  This is important. Contact a health care provider if: You have a fever. Your symptoms get worse. Your symptoms do not improve within 10 days. Get help right away if: You have a severe headache. You have persistent vomiting. You have severe pain or swelling around your face or eyes. You have vision problems. You develop confusion. Your neck is stiff. You have trouble breathing. These symptoms may be an emergency. Get help right away. Call 911. Do not wait to see if the symptoms will go away. Do not drive yourself to the hospital. Summary A sinus infection is soreness and inflammation of your sinuses. Sinuses are hollow spaces in the bones around your face. This condition is caused by nasal tissues that become inflamed or swollen. The swelling traps or blocks the flow of mucus. This allows bacteria, viruses, and fungi to grow, which leads to infection. If you were prescribed an antibiotic medicine, take it as told by your health care provider. Do not stop taking the antibiotic even if you start to feel better. Keep all follow-up visits. This is important. This information is not intended to replace advice given to you by your health care provider. Make sure you discuss any questions you have with your health care provider. Document Revised: 03/12/2021 Document Reviewed: 03/12/2021 Elsevier Patient Education  2024 Elsevier Inc.    If you have been instructed to have an in-person evaluation today at a local Urgent Care facility, please use the link below. It will take you to a list of all of our available [facility], including address, phone number and hours of operation. Please do not delay care.  [facility]  If you or a family member do not have a primary care provider, use the link below to schedule a visit and establish care. When you choose a [facility] primary care physician or advanced practice provider, you gain a long-term partner in health. Find a  Primary Care Provider  Learn more about [facility] in-office and virtual care options: [facility] - Get Care Now

## 2023-02-03 NOTE — Progress Notes (Signed)
Virtual Visit Consent   Madeline Perkins, you are scheduled for a virtual visit with a [facility] provider today. Just as with appointments in the office, your consent must be obtained to participate. Your consent will be active for this visit and any virtual visit you may have with one of our providers in the next 365 days. If you have a MyChart account, a copy of this consent can be sent to you electronically.  As this is a virtual visit, video technology does not allow for your provider to perform a traditional examination. This may limit your provider's ability to fully assess your condition. If your provider identifies any concerns that need to be evaluated in person or the need to arrange testing (such as labs, EKG, etc.), we will make arrangements to do so. Although advances in technology are sophisticated, we cannot ensure that it will always work on either your end or our end. If the connection with a video visit is poor, the visit may have to be switched to a telephone visit. With either a video or telephone visit, we are not always able to ensure that we have a secure connection.  By engaging in this virtual visit, you consent to the provision of healthcare and authorize for your insurance to be billed (if applicable) for the services provided during this visit. Depending on your insurance coverage, you may receive a charge related to this service.  I need to obtain your verbal consent now. Are you willing to proceed with your visit today? Madeline Perkins has provided verbal consent on 02/03/2023 for a virtual visit (video or telephone). [name], PA-C  Date: 02/03/2023 3:33 PM  Virtual Visit via Video Note   I, [name], connected with  Madeline Perkins  ([id number], December 03, 1985) on 02/03/23 at  3:30 PM EDT by a video-enabled telemedicine application and verified that I am speaking with the correct person using two identifiers.  Location: Patient: Virtual Visit Location  Patient: Mobile Provider: Virtual Visit Location Provider: Home Office   I discussed the limitations of evaluation and management by telemedicine and the availability of in person appointments. The patient expressed understanding and agreed to proceed.    History of Present Illness: Madeline Perkins is a 37 y.o. who identifies as a female who was assigned female at birth, and is being seen today for possible sinus infection.  HPI: Sinusitis This is a new problem. The current episode started 1 to 4 weeks ago (over 8 days). The problem has been gradually worsening since onset. Maximum temperature: low grade just last night. The fever has been present for Less than 1 day. Associated symptoms include chills, congestion, coughing (from post nasal drainage), ear pain (pressure not pain), headaches, sinus pressure, a sore throat (from post nasal drainage) and swollen glands. Treatments tried: dayquil, nyquil, advil cold and sinus. The treatment provided no relief.     Problems:  Patient Active Problem List   Diagnosis Date Noted   Pregnancy 04/18/2021   Gestational hypertension 02/06/2017   NSVD (normal spontaneous vaginal delivery) 02/06/2017   Possible pregnancy, not confirmed 06/02/2016   Palpitations 07/10/2014   Melanoma of back (HCC) 07/13/2012   Postop check 05/25/2012    Allergies:  Allergies  Allergen Reactions   Penicillins Hives    Childhood allergy Has patient had a PCN reaction causing immediate rash, facial/tongue/throat swelling, SOB or lightheadedness with hypotension: Yes Has patient had a PCN reaction causing severe rash involving mucus membranes or  skin necrosis: No Has patient had a PCN reaction that required hospitalization: No Has patient had a PCN reaction occurring within the last 10 years: No If all of the above answers are "NO", then may proceed with Cephalosporin use.    Medications:  Current Outpatient Medications:    doxycycline (VIBRA-TABS) 100 MG tablet,  Take 1 tablet (100 mg total) by mouth 2 (two) times daily., Disp: 20 tablet, Rfl: 0   albuterol (VENTOLIN HFA) 108 (90 Base) MCG/ACT inhaler, Inhale 1-2 puffs into the lungs every 6 (six) hours as needed., Disp: 6.7 g, Rfl: 0   fluticasone (FLONASE) 50 MCG/ACT nasal spray, Place 2 sprays into both nostrils daily., Disp: 16 g, Rfl: 0   levonorgestrel (MIRENA, 52 MG,) 20 MCG/DAY IUD, Provided by healthcare center, Disp: , Rfl:    mesalamine (LIALDA) 1.2 g EC tablet, Take 4 tablets (4.8 g total) by mouth daily., Disp: 120 tablet, Rfl: 5   mesalamine (LIALDA) 1.2 g EC tablet, Take 4 tablets (4.8 g total) by mouth daily., Disp: 120 tablet, Rfl: 6   omeprazole (PRILOSEC) 20 MG capsule, Take 20 mg by mouth 2 (two) times daily before a meal., Disp: , Rfl:    ondansetron (ZOFRAN-ODT) 4 MG disintegrating tablet, dissolve  2 tablets (8 mg total) in mouth 2 (two) times daily as needed., Disp: 120 tablet, Rfl: 0   oxybutynin (DITROPAN-XL) 10 MG 24 hr tablet, Take 1 tablet (10 mg total) by mouth daily., Disp: 90 tablet, Rfl: 3   pantoprazole (PROTONIX) 40 MG tablet, Take 1 tablet (40 mg total) by mouth daily., Disp: 30 tablet, Rfl: 2   pantoprazole (PROTONIX) 40 MG tablet, Take 1 tablet (40 mg total) by mouth 2 (two) times daily., Disp: 60 tablet, Rfl: 11   predniSONE (STERAPRED UNI-PAK 21 TAB) 10 MG (21) TBPK tablet, 6 day taper; take as directed on package instructions, Disp: 21 tablet, Rfl: 0   Prenatal Vit-Fe Fumarate-FA (PRENATAL MULTIVITAMIN) TABS tablet, Take 1 tablet by mouth daily at 12 noon., Disp: , Rfl:    promethazine-dextromethorphan (PROMETHAZINE-DM) 6.25-15 MG/5ML syrup, Take 5 mLs by mouth 4 (four) times daily as needed., Disp: 118 mL, Rfl: 0   sucralfate (CARAFATE) 1 g tablet, Take 1 tablet (1 g total) by mouth twice daily on an empty stomach., Disp: 60 tablet, Rfl: 1  Observations/Objective: Patient is well-developed, well-nourished in no acute distress.  Resting comfortably  Head is  normocephalic, atraumatic.  No labored breathing.  Speech is clear and coherent with logical content.  Patient is alert and oriented at baseline.    Assessment and Plan: 1. Acute bacterial sinusitis - doxycycline (VIBRA-TABS) 100 MG tablet; Take 1 tablet (100 mg total) by mouth 2 (two) times daily.  Dispense: 20 tablet; Refill: 0  - Worsening symptoms that have not responded to OTC medications.  - Will give Doxycycline - Continue allergy medications.  - Steam and humidifier can help - Stay well hydrated and get plenty of rest.  - Seek in person evaluation if no symptom improvement or if symptoms worsen   Follow Up Instructions: I discussed the assessment and treatment plan with the patient. The patient was provided an opportunity to ask questions and all were answered. The patient agreed with the plan and demonstrated an understanding of the instructions.  A copy of instructions were sent to the patient via MyChart unless otherwise noted below.   The patient was advised to call back or seek an in-person evaluation if the symptoms worsen or if the  condition fails to improve as anticipated.    [name], PA-C

## 2023-04-18 ENCOUNTER — Other Ambulatory Visit: Payer: Self-pay | Admitting: Medical Genetics

## 2023-04-28 ENCOUNTER — Other Ambulatory Visit: Payer: Self-pay | Admitting: Gastroenterology

## 2023-04-28 DIAGNOSIS — K824 Cholesterolosis of gallbladder: Secondary | ICD-10-CM

## 2023-05-05 ENCOUNTER — Other Ambulatory Visit: Payer: Commercial Managed Care - PPO

## 2023-05-12 ENCOUNTER — Ambulatory Visit
Admission: RE | Admit: 2023-05-12 | Discharge: 2023-05-12 | Disposition: A | Discharge status: Home / Self Care | Payer: Commercial Managed Care - PPO | Source: Ambulatory Visit | Attending: Gastroenterology

## 2023-05-12 DIAGNOSIS — K824 Cholesterolosis of gallbladder: Secondary | ICD-10-CM | POA: Diagnosis not present

## 2023-05-15 ENCOUNTER — Other Ambulatory Visit (HOSPITAL_COMMUNITY): Payer: Self-pay

## 2023-05-16 IMAGING — US US MFM OB FOLLOW-UP
1 series · 13 of 28 positions shown · non-contrast
Comparison: none

[Series 1: us mfm ob follow-up · 59 acquisitions, 13 frames shown]
[im 3/59]
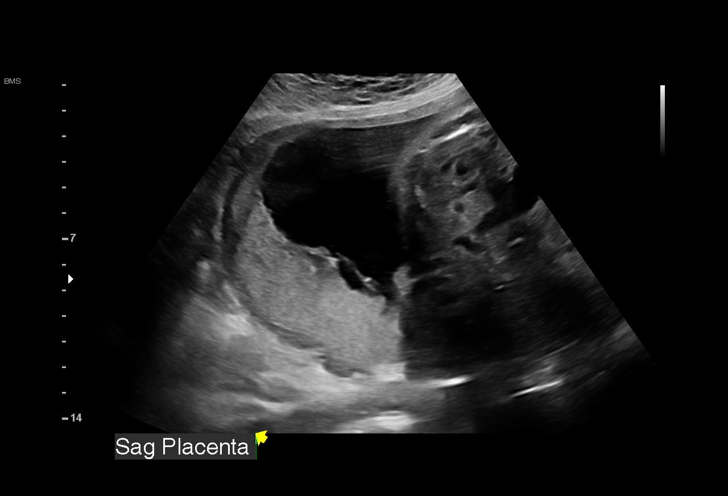
[im 7/59]
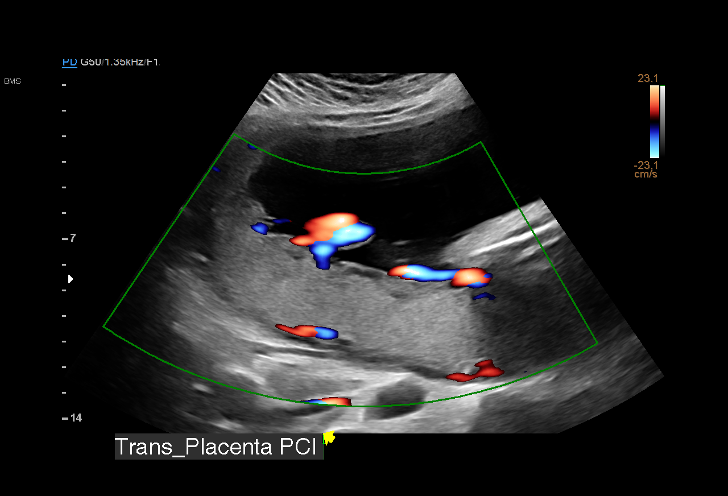
[im 11/59]
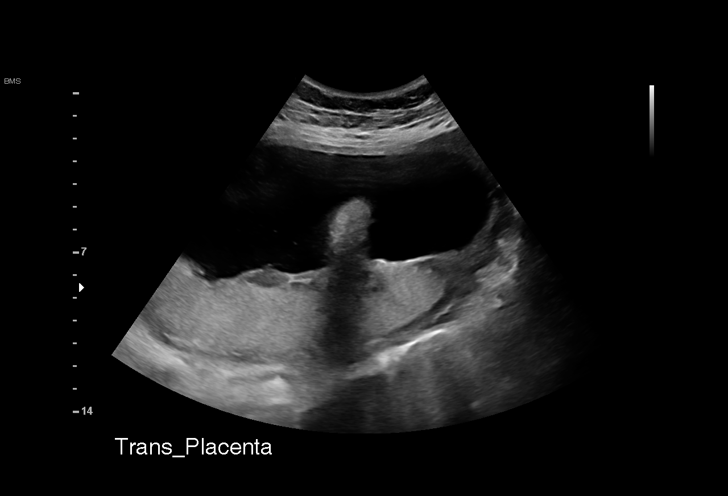
[im 16/59]
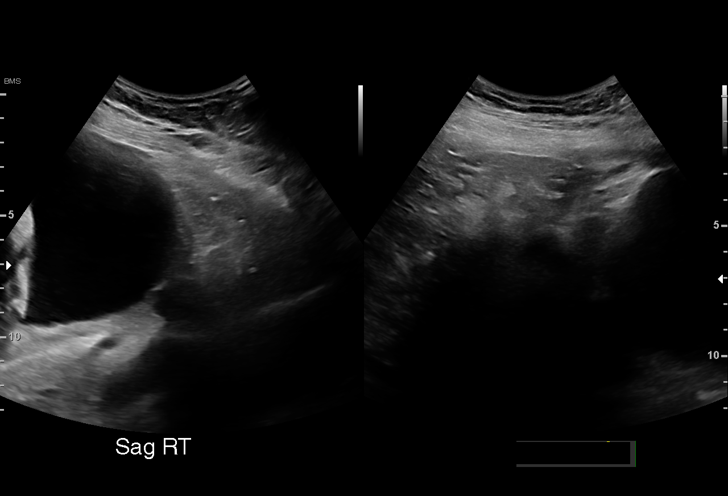
[im 20/59]
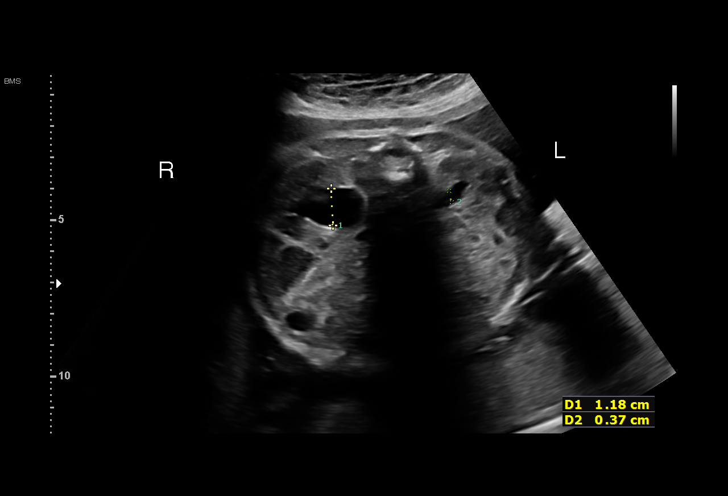
[im 24/59]
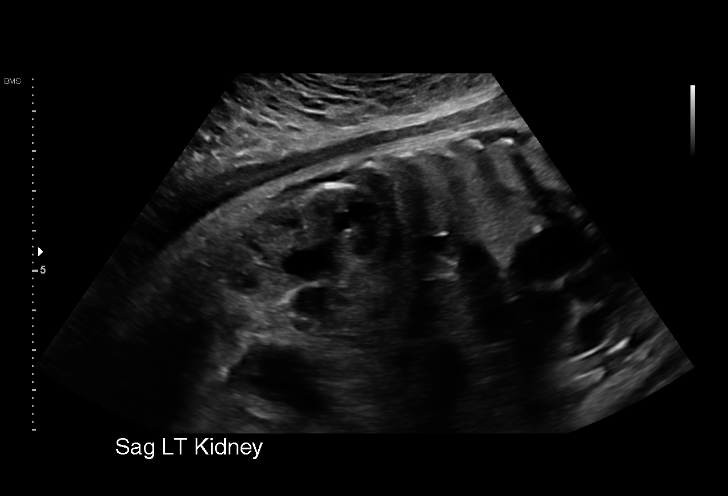
[im 31/59]
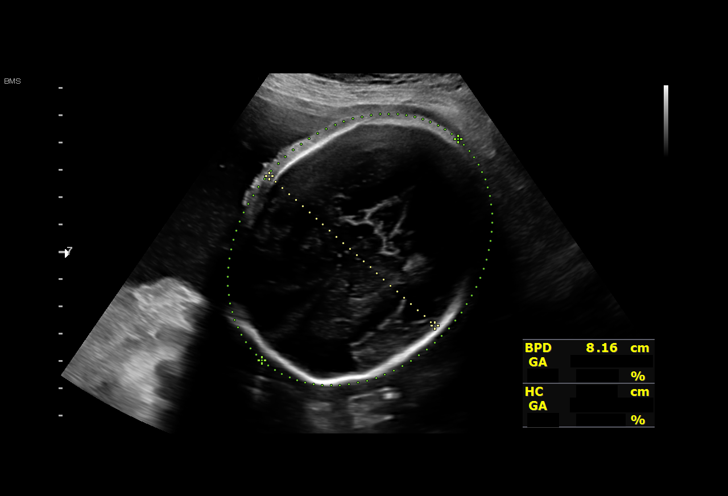
[im 35/59]
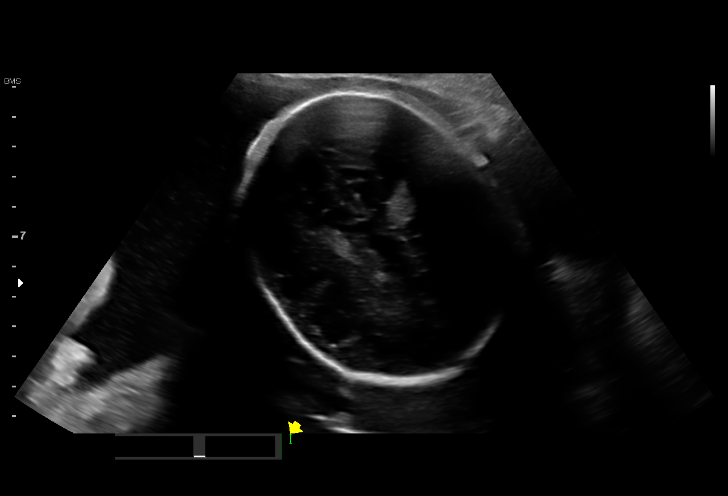
[im 39/59]
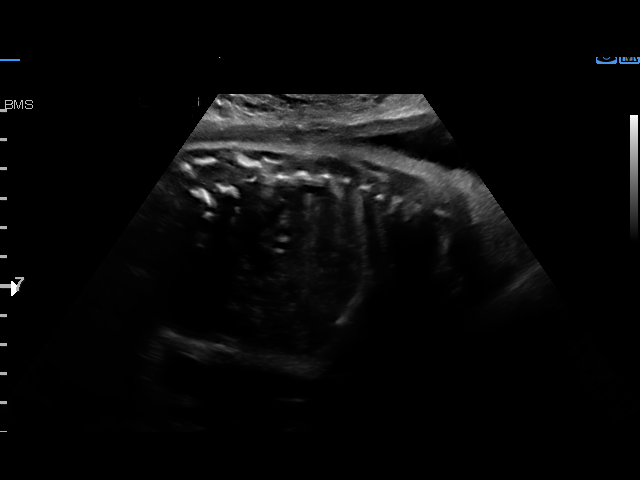
[im 43/59]
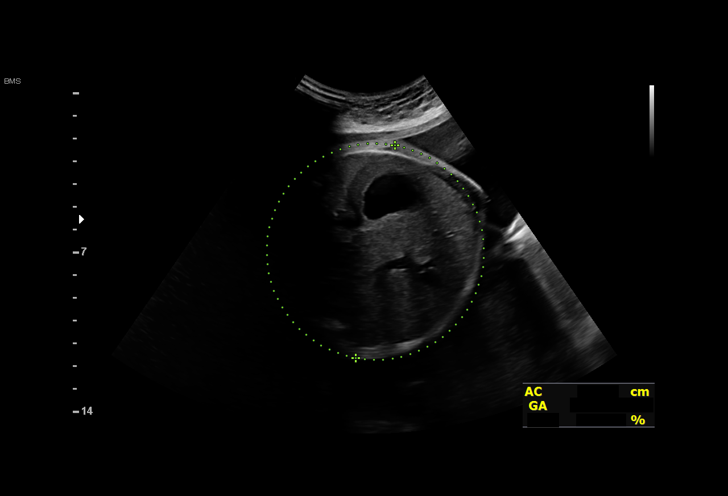
[im 48/59]
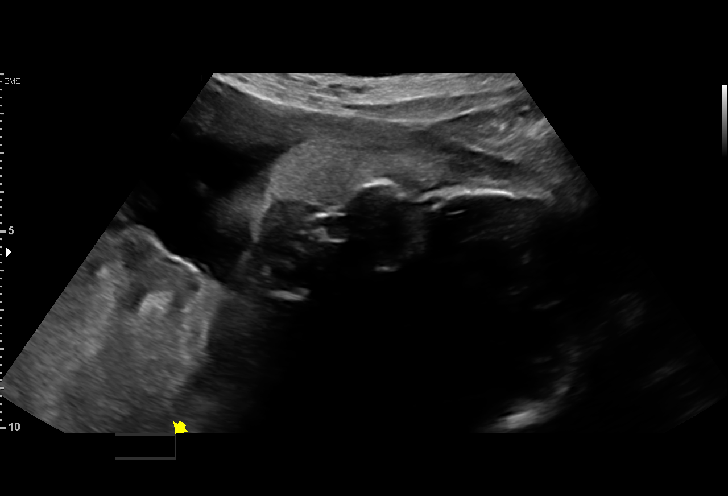
[im 52/59]
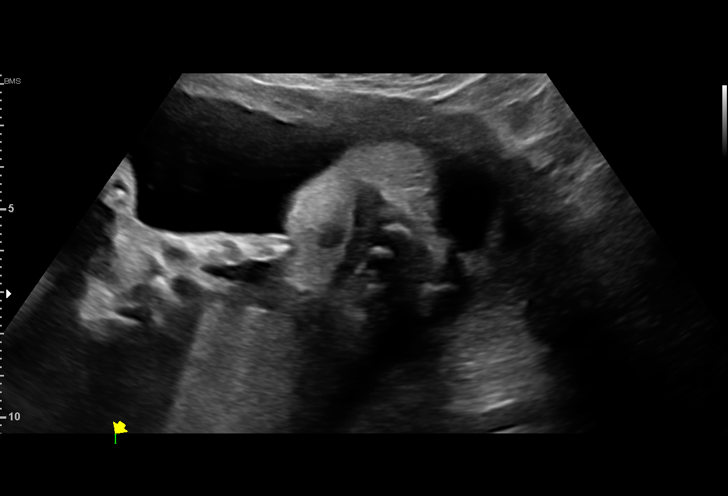
[im 56/59]
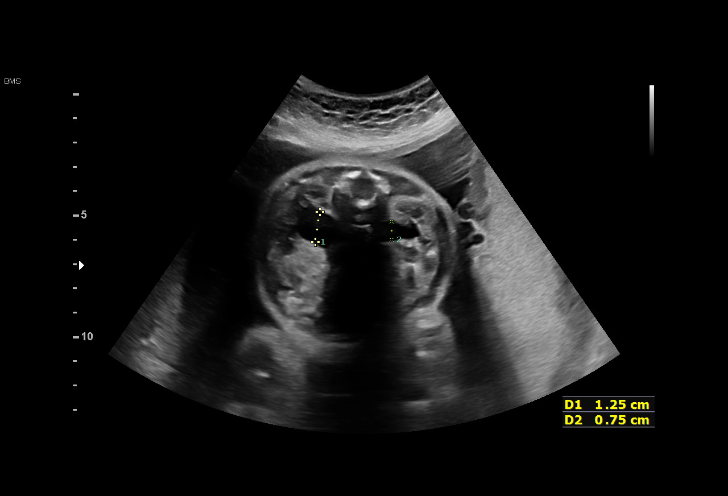

[13 of 28 positions shown; findings below may reference images not displayed]

AYSLIN

Indications

 30 weeks gestation of pregnancy
 Pyelectasis of fetus on prenatal ultrasound
 Fetal choroid plexus cyst
 Medical complication of pregnancy
 (melaoma)
 LR Male
 Neg Horizon
Fetal Evaluation

 Num Of Fetuses:         1
 Fetal Heart Rate(bpm):  137
 Cardiac Activity:       Observed
 Presentation:           Cephalic
 Placenta:               Posterior
 P. Cord Insertion:      Visualized

 Amniotic Fluid
 AFI FV:      Within normal limits

 AFI Sum(cm)     %Tile       Largest Pocket(cm)
 20.12           78

 RUQ(cm)       RLQ(cm)       LUQ(cm)        LLQ(cm)

Biometry

 BPD:      81.7  mm     G. Age:  32w 6d         97  %    CI:        71.89   %    70 - 86
                                                         FL/HC:      18.9   %    19.2 -
 HC:      306.7  mm     G. Age:  34w 1d         98  %    HC/AC:      1.05        0.99 -
 AC:      293.4  mm     G. Age:  33w 2d       > 99  %    FL/BPD:     70.9   %    71 - 87
 FL:       57.9  mm     G. Age:  30w 2d         39  %    FL/AC:      19.7   %    20 - 24
 HUM:      54.1  mm     G. Age:  31w 3d         76  %

 LV:        2.1  mm

 Est. FW:    7847  gm      4 lb 6 oz     98  %
OB History

 Gravidity:    3         Term:   1
Gestational Age

 LMP:           30w 1d        Date:  07/18/20                 EDD:   04/24/21
 U/S Today:     32w 5d                                        EDD:   04/06/21
 Best:          30w 1d     Det. By:  LMP  (07/18/20)          EDD:   04/24/21
Anatomy

 Cranium:               Appears normal         LVOT:                   Previously seen
 Cavum:                 Appears normal         Aortic Arch:            Previously seen
 Ventricles:            Appears normal         Ductal Arch:            Previously seen
 Choroid Plexus:        Rt choroid plexus      Diaphragm:              Appears normal
                        cyst prev vis
 Cerebellum:            Previously seen        Stomach:                Appears normal, left
                                                                       sided
 Posterior Fossa:       Previously seen        Abdomen:                Appears normal
 Nuchal Fold:           Previously seen        Abdominal Wall:         Previously seen
 Face:                  Orbits and profile     Cord Vessels:           Previously seen
                        previously seen
 Lips:                  Previously seen        Kidneys:                Bilat UTD Rt 12-13
                                                                       mm,  Lt 7- 8mm
 Palate:                Not well visualized    Bladder:                Appears normal
 Thoracic:              Previously seen        Spine:                  Previously seen
 Heart:                 Previously seen        Upper Extremities:      Prev Visualized
 RVOT:                  Previously seen        Lower Extremities:      Prev Visualized

 Other:  BCV previously visualized. 3VV and 3VTV not visualized. Fetus
         appears to be a male. Right upper extremity visualized and WNL.
Cervix Uterus Adnexa

 Cervix
 Not visualized (advanced GA >10wks)

 Uterus
 No abnormality visualized.

 Right Ovary
 Not visualized.
 Left Ovary
 Not visualized.

 Cul De Sac
 No free fluid seen.

 Adnexa
 No adnexal mass visualized.
Impression

 Patient returned for fetal growth and renal assessments.
 Bilateral urinary tract dilations were seen at previous scans.
 She does not have gestational diabetes.  Blood pressure
 today at her office is 109/65 mmHg.

 On today's ultrasound, amniotic fluid is normal good fetal
 activity seen.  Fetal bladder is seen and appears normal.
 Fetal growth is appropriate for gestational age.  Bilateral
 urinary tract dilations with right measuring 12 to 13 mm in the
 left measuring 7 to 8 mm (7 mm he is upper limit of normal
 for this gestation) are seen.  Both kidneys, otherwise, appear
 normal with no increased echogenicities.

 I reassured the patient of the findings and that no early
 delivery is indicated provided amniotic fluid remains within
 normal range.
Recommendations

 -An appointment was made for her to return in 4 weeks for
 fetal growth and renal assessments.
                 Varrosi, Hell[place]ianz

## 2023-05-27 ENCOUNTER — Other Ambulatory Visit (HOSPITAL_COMMUNITY): Payer: Self-pay

## 2023-06-01 ENCOUNTER — Other Ambulatory Visit (HOSPITAL_COMMUNITY): Payer: Self-pay

## 2023-07-01 DIAGNOSIS — K824 Cholesterolosis of gallbladder: Secondary | ICD-10-CM | POA: Diagnosis not present

## 2023-07-01 NOTE — Progress Notes (Signed)
 REFERRING PHYSICIAN:  Sebastian Dann Loving,* PROVIDER:  DANN LOVING SEBASTIAN, MD MRN: [id number] DOB: 04/12/1986 DATE OF ENCOUNTER: 07/01/2023 Subjective    Chief Complaint: New Consultation Coby Bladder Issues)   History of Present Illness: Madeline Perkins is a 38 y.o. female who is seen today as an office consultation for evaluation of New Consultation (Gall Bladder Issues)   Dr. Saintclair asked me to see Mrs. Batten in consultation regarding a GB polyp.  This was initially found on imaging last summer when she was having some epigastric pain.  She was given Carafate  and that has improved her symptoms a lot but she still occasionally has right upper quadrant pain right beneath her ribs.  Follow-up ultrasound has been done showing the polyp is pretty stable in size.  There was a clerical error on the x-ray report that made it seem like it had grown significantly.  Of note, she is status post laparoscopic appendectomy by Dr. Kimble in 2012.    Review of Systems: A complete review of systems was obtained from the patient.  I have reviewed this information and discussed as appropriate with the patient.  See HPI as well for other ROS.  ROS   Medical History: Past Medical History:  Diagnosis Date  . GERD (gastroesophageal reflux disease)   . History of cancer     There is no problem list on file for this patient.   History reviewed. No pertinent surgical history.   Allergies  Allergen Reactions  . Penicillins Hives    Childhood allergy  Has patient had a PCN reaction causing immediate rash, facial/tongue/throat swelling, SOB or lightheadedness with hypotension: Yes  Has patient had a PCN reaction causing severe rash involving mucus membranes or skin necrosis: No  Has patient had a PCN reaction that required hospitalization: No  Has patient had a PCN reaction occurring within the last 10 years: No  If all of the above answers are NO, then may proceed with Cephalosporin use.     Current Outpatient Medications on File Prior to Visit  Medication Sig Dispense Refill  . albuterol  MDI, PROVENTIL , VENTOLIN , PROAIR , HFA 90 mcg/actuation inhaler Inhale 1-2 inhalations into the lungs    . fluticasone  propionate (FLONASE ) 50 mcg/actuation nasal spray Place 2 sprays into one nostril once daily    . levonorgestreL  (MIRENA ) IUD Provided by healthcare center    . mesalamine  (LIALDA ) 1.2 gram EC tablet Take 4.8 g by mouth    . MYRBETRIQ 25 mg ER Tablet Take 25 mg by mouth once daily    . omeprazole (PRILOSEC) 20 MG DR capsule Take 20 mg by mouth    . oxyBUTYnin  (DITROPAN -XL) 10 MG XL tablet Take 1 tablet by mouth once daily    . pantoprazole  (PROTONIX ) 40 MG DR tablet Take 40 mg by mouth once daily    . sucralfate  (CARAFATE ) 1 gram tablet Take 1 g by mouth     No current facility-administered medications on file prior to visit.    Family History  Problem Relation Age of Onset  . High blood pressure (Hypertension) Mother   . High blood pressure (Hypertension) Father   . Hyperlipidemia (Elevated cholesterol) Father   . Deep vein thrombosis (DVT or abnormal blood clot formation) Father      Social History   Tobacco Use  Smoking Status Never  Smokeless Tobacco Never     Social History   Socioeconomic History  . Marital status: Married  Tobacco Use  . Smoking status: Never  .  Smokeless tobacco: Never  Vaping Use  . Vaping status: Never Used  Substance and Sexual Activity  . Alcohol use: Never  . Drug use: Never   Social Drivers of Health   Housing Stability: Unknown (07/01/2023)   Housing Stability Vital Sign   . Homeless in the Last Year: No    Objective:   Vitals:   07/01/23 0919 07/01/23 0920  BP: 114/78   Pulse: 95   Temp: 36.9 C (98.5 F)   SpO2: 98%   Weight: 83 kg (183 lb)   Height: 180.3 cm (5' 11)   PainSc:  0-No pain  PainLoc:  Abdomen    Body mass index is 25.52 kg/m.  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  I have offered laparoscopic cholecystectomy.  I discussed the procedure, risks, and benefits with her.  I also discussed the expected postoperative course.  We will look forward to scheduling sometime this spring.    DANN DALLAS HUMMER, MD    I spent a total of in both face-to-face and non-face-to-face activities, excluding procedures performed, for this visit on the date of this encounter.

## 2023-07-22 DIAGNOSIS — Z1231 Encounter for screening mammogram for malignant neoplasm of breast: Secondary | ICD-10-CM | POA: Diagnosis not present

## 2023-07-22 DIAGNOSIS — N393 Stress incontinence (female) (male): Secondary | ICD-10-CM | POA: Diagnosis not present

## 2023-07-22 DIAGNOSIS — Z01419 Encounter for gynecological examination (general) (routine) without abnormal findings: Secondary | ICD-10-CM | POA: Diagnosis not present

## 2023-07-22 DIAGNOSIS — Z6825 Body mass index (BMI) 25.0-25.9, adult: Secondary | ICD-10-CM | POA: Diagnosis not present

## 2023-07-22 DIAGNOSIS — Z124 Encounter for screening for malignant neoplasm of cervix: Secondary | ICD-10-CM | POA: Diagnosis not present

## 2023-08-03 NOTE — Progress Notes (Signed)
 Surgical Instructions   Your procedure is scheduled on Friday, April 25th.  Report to [facility] Main Entrance "A" at 7:00 A.M., then check in with the Admitting office. Any questions or running late day of surgery: call [phone]  Questions prior to your surgery date: call [phone], Monday-Friday, 8am-4pm. If you experience any cold or flu symptoms such as cough, fever, chills, shortness of breath, etc. between now and your scheduled surgery, please notify us at the above number.     Remember:  Do not eat after midnight the night before your surgery   You may drink clear liquids until 6:00 the morning of your surgery.   Clear liquids allowed are: Water, Non-Citrus Juices (without pulp), Carbonated Beverages, Clear Tea (no milk, honey, etc.), Black Coffee Only (NO MILK, CREAM OR POWDERED CREAMER of any kind), and Gatorade.    Take these medicines the morning of surgery with A SIP OF WATER  famotidine (PEPCID)  mesalamine (LIALDA)    May take these medicines IF NEEDED: albuterol (VENTOLIN HFA) inhaler - please bring with you on day of surgery.     One week prior to surgery, STOP taking any Aspirin (unless otherwise instructed by your surgeon) Aleve, Naproxen, Ibuprofen, Motrin, Advil, Goody's, BC's, all herbal medications, fish oil, and non-prescription vitamins.                     Do NOT Smoke (Tobacco/Vaping) for 24 hours prior to your procedure.  If you use a CPAP at night, you may bring your mask/headgear for your overnight stay.   You will be asked to remove any contacts, glasses, piercing's, hearing aid's, dentures/partials prior to surgery. Please bring cases for these items if needed.    Patients discharged the day of surgery will not be allowed to drive home, and someone needs to stay with them for 24 hours.  SURGICAL WAITING ROOM VISITATION Patients may have no more than 2 support people in the waiting area - these visitors may rotate.   Pre-op nurse will  coordinate an appropriate time for 1 ADULT support person, who may not rotate, to accompany patient in pre-op.  Children under the age of 3 must have an adult with them who is not the patient and must remain in the main waiting area with an adult.  If the patient needs to stay at the hospital during part of their recovery, the visitor guidelines for inpatient rooms apply.  Please refer to the [facility] website for the visitor guidelines for any additional information.   If you received a COVID test during your pre-op visit  it is requested that you wear a mask when out in public, stay away from anyone that may not be feeling well and notify your surgeon if you develop symptoms. If you have been in contact with anyone that has tested positive in the last 10 days please notify you surgeon.      Pre-operative CHG Bathing Instructions   You can play a key role in reducing the risk of infection after surgery. Your skin needs to be as free of germs as possible. You can reduce the number of germs on your skin by washing with CHG (chlorhexidine gluconate) soap before surgery. CHG is an antiseptic soap that kills germs and continues to kill germs even after washing.   DO NOT use if you have an allergy to chlorhexidine/CHG or antibacterial soaps. If your skin becomes reddened or irritated, stop using the CHG and notify one of  our RNs at [phone].              TAKE A SHOWER THE NIGHT BEFORE SURGERY AND THE DAY OF SURGERY    Please keep in mind the following:  DO NOT shave, including legs and underarms, 48 hours prior to surgery.   You may shave your face before/day of surgery.  Place clean sheets on your bed the night before surgery Use a clean washcloth (not used since being washed) for each shower. DO NOT sleep with pet's night before surgery.  CHG Shower Instructions:  Wash your face and private area with normal soap. If you choose to wash your hair, wash first with your normal shampoo.   After you use shampoo/soap, rinse your hair and body thoroughly to remove shampoo/soap residue.  Turn the water OFF and apply half the bottle of CHG soap to a CLEAN washcloth.  Apply CHG soap ONLY FROM YOUR NECK DOWN TO YOUR TOES (washing for 3-5 minutes)  DO NOT use CHG soap on face, private areas, open wounds, or sores.  Pay special attention to the area where your surgery is being performed.  If you are having back surgery, having someone wash your back for you may be helpful. Wait 2 minutes after CHG soap is applied, then you may rinse off the CHG soap.  Pat dry with a clean towel  Put on clean pajamas    Additional instructions for the day of surgery: DO NOT APPLY any lotions, deodorants, cologne, or perfumes.   Do not wear jewelry or makeup Do not wear nail polish, gel polish, artificial nails, or any other type of covering on natural nails (fingers and toes) Do not bring valuables to the hospital. [facility] is not responsible for valuables/personal belongings. Put on clean/comfortable clothes.  Please brush your teeth.  Ask your nurse before applying any prescription medications to the skin.

## 2023-08-04 ENCOUNTER — Encounter (HOSPITAL_COMMUNITY)
Admission: RE | Admit: 2023-08-04 | Discharge: 2023-08-04 | Disposition: A | Discharge status: Home / Self Care | Source: Ambulatory Visit | Attending: General Surgery | Admitting: General Surgery

## 2023-08-04 ENCOUNTER — Encounter (HOSPITAL_COMMUNITY): Payer: Self-pay

## 2023-08-04 ENCOUNTER — Other Ambulatory Visit: Payer: Self-pay

## 2023-08-04 VITALS — BP 132/83 | HR 81 | Temp 98.1°F | Resp 17 | Ht 71.0 in | Wt 185.0 lb

## 2023-08-04 DIAGNOSIS — Z01818 Encounter for other preprocedural examination: Secondary | ICD-10-CM | POA: Diagnosis present

## 2023-08-04 DIAGNOSIS — Z01812 Encounter for preprocedural laboratory examination: Secondary | ICD-10-CM | POA: Diagnosis not present

## 2023-08-04 LAB — CBC
HCT: 40.8 % (ref 36.0–46.0)
Hemoglobin: 13.1 g/dL (ref 12.0–15.0)
MCH: 27 pg (ref 26.0–34.0)
MCHC: 32.1 g/dL (ref 30.0–36.0)
MCV: 84.1 fL (ref 80.0–100.0)
Platelets: 325 10*3/uL (ref 150–400)
RBC: 4.85 MIL/uL (ref 3.87–5.11)
RDW: 13.2 % (ref 11.5–15.5)
WBC: 9.8 10*3/uL (ref 4.0–10.5)
nRBC: 0 % (ref 0.0–0.2)

## 2023-08-04 LAB — BASIC METABOLIC PANEL WITH GFR
Anion gap: 7 (ref 5–15)
BUN: 9 mg/dL (ref 6–20)
CO2: 27 mmol/L (ref 22–32)
Calcium: 9.3 mg/dL (ref 8.9–10.3)
Chloride: 105 mmol/L (ref 98–111)
Creatinine, Ser: 0.85 mg/dL (ref 0.44–1.00)
GFR, Estimated: >60 mL/min (ref >60)
Glucose, Bld: 67 mg/dL — ABNORMAL LOW (ref 70–99)
Potassium: 3.7 mmol/L (ref 3.5–5.1)
Sodium: 139 mmol/L (ref 135–145)

## 2023-08-04 NOTE — Progress Notes (Signed)
 PCP - Dr. Denman Fischer Cardiologist - Denies  PPM/ICD - Denies Device Orders - N/A Rep Notified - n/a  Chest x-ray - n/a EKG - 02-02-15 Stress Test - 08-02-14 ECHO - 07-17-14 Cardiac Cath - Denies  Sleep Study - Denies CPAP - n/a  Non-diabetic  Last dose of GLP1 agonist-  Denies GLP1 instructions: n/a  Blood Thinner Instructions:Denies Aspirin Instructions:Denies  ERAS Protcol - Clears until 0600 PRE-SURGERY Ensure or G2- none  COVID TEST- n/a   Anesthesia review: No  Patient denies shortness of breath, fever, cough and chest pain at PAT appointment. Patient denies any respiratory issues at this time.    All instructions explained to the patient, with a verbal understanding of the material. Patient agrees to go over the instructions while at home for a better understanding. Patient also instructed to self quarantine after being tested for COVID-19. The opportunity to ask questions was provided.

## 2023-08-11 ENCOUNTER — Other Ambulatory Visit (HOSPITAL_COMMUNITY): Payer: Self-pay

## 2023-08-13 DIAGNOSIS — Z01818 Encounter for other preprocedural examination: Secondary | ICD-10-CM

## 2023-08-14 ENCOUNTER — Other Ambulatory Visit (HOSPITAL_COMMUNITY): Payer: Self-pay

## 2023-08-14 ENCOUNTER — Ambulatory Visit (HOSPITAL_BASED_OUTPATIENT_CLINIC_OR_DEPARTMENT_OTHER): Payer: Self-pay | Admitting: Anesthesiology

## 2023-08-14 ENCOUNTER — Ambulatory Visit (HOSPITAL_COMMUNITY): Payer: Self-pay | Admitting: Anesthesiology

## 2023-08-14 ENCOUNTER — Encounter (HOSPITAL_COMMUNITY): Payer: Self-pay | Admitting: General Surgery

## 2023-08-14 ENCOUNTER — Other Ambulatory Visit: Payer: Self-pay

## 2023-08-14 ENCOUNTER — Ambulatory Visit (HOSPITAL_COMMUNITY)
Admission: RE | Admit: 2023-08-14 | Discharge: 2023-08-14 | Disposition: A | Discharge status: Home / Self Care | Attending: General Surgery | Admitting: General Surgery

## 2023-08-14 ENCOUNTER — Encounter (HOSPITAL_COMMUNITY)
Admission: RE | Disposition: A | Discharge status: Home / Self Care | Payer: Self-pay | Source: Home / Self Care | Attending: General Surgery

## 2023-08-14 DIAGNOSIS — K219 Gastro-esophageal reflux disease without esophagitis: Secondary | ICD-10-CM | POA: Diagnosis not present

## 2023-08-14 DIAGNOSIS — K828 Other specified diseases of gallbladder: Secondary | ICD-10-CM | POA: Diagnosis not present

## 2023-08-14 DIAGNOSIS — K824 Cholesterolosis of gallbladder: Secondary | ICD-10-CM

## 2023-08-14 DIAGNOSIS — K811 Chronic cholecystitis: Secondary | ICD-10-CM | POA: Insufficient documentation

## 2023-08-14 DIAGNOSIS — Z01818 Encounter for other preprocedural examination: Secondary | ICD-10-CM

## 2023-08-14 HISTORY — PX: CHOLECYSTECTOMY: SHX55

## 2023-08-14 LAB — POCT PREGNANCY, URINE: Preg Test, Ur: NEGATIVE

## 2023-08-14 SURGERY — LAPAROSCOPIC CHOLECYSTECTOMY
Anesthesia: General

## 2023-08-14 MED ORDER — LIDOCAINE 2% (20 MG/ML) 5 ML SYRINGE
INTRAMUSCULAR | Status: AC
  Filled 2023-08-14: qty 5

## 2023-08-14 MED ORDER — ORAL CARE MOUTH RINSE: 15.0000 mL | Freq: Once | OROMUCOSAL | Status: AC

## 2023-08-14 MED ORDER — KETOROLAC TROMETHAMINE 30 MG/ML IJ SOLN
INTRAMUSCULAR | Status: DC | PRN
  Administered 2023-08-14: 30 mg via INTRAVENOUS

## 2023-08-14 MED ORDER — OXYCODONE HCL 5 MG/5ML PO SOLN: 5.0000 mg | Freq: Once | ORAL | Status: AC | PRN

## 2023-08-14 MED ORDER — EPHEDRINE SULFATE-NACL 50-0.9 MG/10ML-% IV SOSY
PREFILLED_SYRINGE | INTRAVENOUS | Status: DC | PRN
  Administered 2023-08-14: 7.5 mg via INTRAVENOUS
  Administered 2023-08-14: 2.5 mg via INTRAVENOUS

## 2023-08-14 MED ORDER — ONDANSETRON HCL 4 MG/2ML IJ SOLN
INTRAMUSCULAR | Status: DC | PRN
  Administered 2023-08-14: 4 mg via INTRAVENOUS

## 2023-08-14 MED ORDER — PHENYLEPHRINE 80 MCG/ML (10ML) SYRINGE FOR IV PUSH (FOR BLOOD PRESSURE SUPPORT)
PREFILLED_SYRINGE | INTRAVENOUS | Status: AC
  Filled 2023-08-14: qty 10

## 2023-08-14 MED ORDER — KETAMINE HCL 50 MG/5ML IJ SOSY
PREFILLED_SYRINGE | INTRAMUSCULAR | Status: AC
  Filled 2023-08-14: qty 5

## 2023-08-14 MED ORDER — HYDROMORPHONE HCL 1 MG/ML IJ SOLN
INTRAMUSCULAR | Status: AC
  Filled 2023-08-14: qty 1

## 2023-08-14 MED ORDER — DEXAMETHASONE SODIUM PHOSPHATE 10 MG/ML IJ SOLN
INTRAMUSCULAR | Status: DC | PRN
  Administered 2023-08-14: 5 mg via INTRAVENOUS

## 2023-08-14 MED ORDER — DEXAMETHASONE SODIUM PHOSPHATE 10 MG/ML IJ SOLN
INTRAMUSCULAR | Status: AC
  Filled 2023-08-14: qty 1

## 2023-08-14 MED ORDER — PROPOFOL 10 MG/ML IV BOLUS
INTRAVENOUS | Status: AC
  Filled 2023-08-14: qty 20

## 2023-08-14 MED ORDER — ACETAMINOPHEN 500 MG PO TABS
ORAL_TABLET | ORAL | Status: AC
  Administered 2023-08-14: 1000 mg via ORAL
  Filled 2023-08-14: qty 2

## 2023-08-14 MED ORDER — ROCURONIUM BROMIDE 10 MG/ML (PF) SYRINGE
PREFILLED_SYRINGE | INTRAVENOUS | Status: AC
  Filled 2023-08-14: qty 10

## 2023-08-14 MED ORDER — PROPOFOL 10 MG/ML IV BOLUS
INTRAVENOUS | Status: DC | PRN
  Administered 2023-08-14: 200 mg via INTRAVENOUS

## 2023-08-14 MED ORDER — MIDAZOLAM HCL 2 MG/2ML IJ SOLN
INTRAMUSCULAR | Status: AC
  Filled 2023-08-14: qty 2

## 2023-08-14 MED ORDER — CEFAZOLIN SODIUM-DEXTROSE 2-4 GM/100ML-% IV SOLN
2.0000 g | Freq: Once | INTRAVENOUS | Status: AC
  Administered 2023-08-14: 2 g via INTRAVENOUS

## 2023-08-14 MED ORDER — SCOPOLAMINE 1 MG/3DAYS TD PT72: 1.0000 | MEDICATED_PATCH | TRANSDERMAL | Status: DC

## 2023-08-14 MED ORDER — SUGAMMADEX SODIUM 200 MG/2ML IV SOLN
INTRAVENOUS | Status: DC | PRN
Start: 2023-08-14 — End: 2023-08-14
  Administered 2023-08-14: 170 mg via INTRAVENOUS

## 2023-08-14 MED ORDER — OXYCODONE HCL 5 MG PO TABS
5.0000 mg | ORAL_TABLET | Freq: Four times a day (QID) | ORAL | 0 refills | Status: AC | PRN
  Filled 2023-08-14: qty 15, 4d supply, fill #0

## 2023-08-14 MED ORDER — OXYCODONE HCL 5 MG PO TABS
5.0000 mg | ORAL_TABLET | Freq: Once | ORAL | Status: AC | PRN
  Administered 2023-08-14: 5 mg via ORAL

## 2023-08-14 MED ORDER — CHLORHEXIDINE GLUCONATE 0.12 % MT SOLN
OROMUCOSAL | Status: AC
Start: 2023-08-14 — End: 2023-08-14
  Administered 2023-08-14: 15 mL via OROMUCOSAL
  Filled 2023-08-14: qty 15

## 2023-08-14 MED ORDER — CEFAZOLIN SODIUM-DEXTROSE 2-4 GM/100ML-% IV SOLN
INTRAVENOUS | Status: AC
  Filled 2023-08-14: qty 100

## 2023-08-14 MED ORDER — KETOROLAC TROMETHAMINE 30 MG/ML IJ SOLN
INTRAMUSCULAR | Status: AC
  Filled 2023-08-14: qty 1

## 2023-08-14 MED ORDER — KETAMINE HCL 50 MG/5ML IJ SOSY
PREFILLED_SYRINGE | INTRAMUSCULAR | Status: DC | PRN
  Administered 2023-08-14: 40 mg via INTRAVENOUS

## 2023-08-14 MED ORDER — MEPERIDINE HCL 25 MG/ML IJ SOLN: 6.2500 mg | INTRAMUSCULAR | Status: DC | PRN

## 2023-08-14 MED ORDER — MIDAZOLAM HCL 2 MG/2ML IJ SOLN
INTRAMUSCULAR | Status: DC | PRN
  Administered 2023-08-14: 2 mg via INTRAVENOUS

## 2023-08-14 MED ORDER — ROCURONIUM BROMIDE 10 MG/ML (PF) SYRINGE
PREFILLED_SYRINGE | INTRAVENOUS | Status: DC | PRN
  Administered 2023-08-14: 50 mg via INTRAVENOUS

## 2023-08-14 MED ORDER — HYDROMORPHONE HCL 1 MG/ML IJ SOLN
0.2500 mg | INTRAMUSCULAR | Status: DC | PRN
  Administered 2023-08-14 (×3): 0.5 mg via INTRAVENOUS

## 2023-08-14 MED ORDER — AMISULPRIDE (ANTIEMETIC) 5 MG/2ML IV SOLN: 10.0000 mg | Freq: Once | INTRAVENOUS | Status: DC | PRN

## 2023-08-14 MED ORDER — FENTANYL CITRATE (PF) 250 MCG/5ML IJ SOLN
INTRAMUSCULAR | Status: AC
  Filled 2023-08-14: qty 5

## 2023-08-14 MED ORDER — PHENYLEPHRINE 80 MCG/ML (10ML) SYRINGE FOR IV PUSH (FOR BLOOD PRESSURE SUPPORT)
PREFILLED_SYRINGE | INTRAVENOUS | Status: DC | PRN
  Administered 2023-08-14: 120 ug via INTRAVENOUS
  Administered 2023-08-14: 80 ug via INTRAVENOUS
  Administered 2023-08-14: 160 ug via INTRAVENOUS

## 2023-08-14 MED ORDER — LIDOCAINE HCL (CARDIAC) PF 100 MG/5ML IV SOSY
PREFILLED_SYRINGE | INTRAVENOUS | Status: DC | PRN
  Administered 2023-08-14: 60 mg via INTRAVENOUS

## 2023-08-14 MED ORDER — ONDANSETRON HCL 4 MG/2ML IJ SOLN
INTRAMUSCULAR | Status: AC
  Filled 2023-08-14: qty 2

## 2023-08-14 MED ORDER — FENTANYL CITRATE (PF) 250 MCG/5ML IJ SOLN
INTRAMUSCULAR | Status: DC | PRN
  Administered 2023-08-14: 50 ug via INTRAVENOUS
  Administered 2023-08-14: 100 ug via INTRAVENOUS

## 2023-08-14 MED ORDER — BUPIVACAINE-EPINEPHRINE 0.25% -1:200000 IJ SOLN
INTRAMUSCULAR | Status: DC | PRN
  Administered 2023-08-14: 17 mL

## 2023-08-14 MED ORDER — CHLORHEXIDINE GLUCONATE 0.12 % MT SOLN: 15.0000 mL | Freq: Once | OROMUCOSAL | Status: AC

## 2023-08-14 MED ORDER — LACTATED RINGERS IV SOLN
INTRAVENOUS | Status: DC
Start: 2023-08-14 — End: 2023-08-14

## 2023-08-14 MED ORDER — OXYCODONE HCL 5 MG PO TABS
ORAL_TABLET | ORAL | Status: AC
  Filled 2023-08-14: qty 1

## 2023-08-14 MED ORDER — EPHEDRINE 5 MG/ML INJ
INTRAVENOUS | Status: AC
  Filled 2023-08-14: qty 5

## 2023-08-14 MED ORDER — SCOPOLAMINE 1 MG/3DAYS TD PT72
MEDICATED_PATCH | TRANSDERMAL | Status: AC
  Administered 2023-08-14: 1.5 mg via TRANSDERMAL
  Filled 2023-08-14: qty 1

## 2023-08-14 MED ORDER — ACETAMINOPHEN 500 MG PO TABS: 1000.0000 mg | ORAL_TABLET | Freq: Once | ORAL | Status: AC

## 2023-08-14 MED ORDER — ONDANSETRON HCL 4 MG/2ML IJ SOLN: 4.0000 mg | Freq: Once | INTRAMUSCULAR | Status: DC | PRN

## 2023-08-14 MED ORDER — BUPIVACAINE-EPINEPHRINE (PF) 0.25% -1:200000 IJ SOLN
INTRAMUSCULAR | Status: AC
  Filled 2023-08-14: qty 30

## 2023-08-14 SURGICAL SUPPLY — 34 items
BAG COUNTER SPONGE SURGICOUNT (BAG) ×1 IMPLANT
BLADE CLIPPER SURG (BLADE) IMPLANT
CANISTER SUCT 3000ML PPV (MISCELLANEOUS) ×1 IMPLANT
CHLORAPREP W/TINT 26 (MISCELLANEOUS) ×1 IMPLANT
CLIP APPLIE 5 13 M/L LIGAMAX5 (MISCELLANEOUS) ×1 IMPLANT
COVER SURGICAL LIGHT HANDLE (MISCELLANEOUS) ×1 IMPLANT
DERMABOND ADVANCED .7 DNX12 (GAUZE/BANDAGES/DRESSINGS) ×1 IMPLANT
ELECTRODE REM PT RTRN 9FT ADLT (ELECTROSURGICAL) ×1 IMPLANT
GLOVE BIO SURGEON STRL SZ8 (GLOVE) ×1 IMPLANT
GLOVE BIOGEL PI IND STRL 8 (GLOVE) ×1 IMPLANT
GOWN STRL REUS W/ TWL LRG LVL3 (GOWN DISPOSABLE) ×2 IMPLANT
GOWN STRL REUS W/ TWL XL LVL3 (GOWN DISPOSABLE) ×1 IMPLANT
IRRIGATION SUCT STRKRFLW 2 WTP (MISCELLANEOUS) ×1 IMPLANT
KIT BASIN OR (CUSTOM PROCEDURE TRAY) ×1 IMPLANT
KIT TURNOVER KIT B (KITS) ×1 IMPLANT
LHOOK LAP DISP 36CM (ELECTROSURGICAL) ×1 IMPLANT
NDL 22X1.5 STRL (OR ONLY) (MISCELLANEOUS) ×1 IMPLANT
NEEDLE 22X1.5 STRL (OR ONLY) (MISCELLANEOUS) ×1 IMPLANT
NS IRRIG 1000ML POUR BTL (IV SOLUTION) ×1 IMPLANT
PAD ARMBOARD POSITIONER FOAM (MISCELLANEOUS) ×1 IMPLANT
PENCIL BUTTON HOLSTER BLD 10FT (ELECTRODE) ×1 IMPLANT
POUCH RETRIEVAL ECOSAC 10 (ENDOMECHANICALS) ×1 IMPLANT
SCISSORS LAP 5X35 DISP (ENDOMECHANICALS) ×1 IMPLANT
SET TUBE SMOKE EVAC HIGH FLOW (TUBING) ×1 IMPLANT
SLEEVE Z-THREAD 5X100MM (TROCAR) ×2 IMPLANT
SPECIMEN JAR SMALL (MISCELLANEOUS) ×1 IMPLANT
SUT VIC AB 4-0 PS2 27 (SUTURE) ×1 IMPLANT
TOWEL GREEN STERILE (TOWEL DISPOSABLE) ×1 IMPLANT
TOWEL GREEN STERILE FF (TOWEL DISPOSABLE) ×1 IMPLANT
TRAY LAPAROSCOPIC MC (CUSTOM PROCEDURE TRAY) ×1 IMPLANT
TROCAR BALLN 12MMX100 BLUNT (TROCAR) ×1 IMPLANT
TROCAR Z-THREAD OPTICAL 5X100M (TROCAR) ×1 IMPLANT
WARMER LAPAROSCOPE (MISCELLANEOUS) ×1 IMPLANT
WATER STERILE IRR 1000ML POUR (IV SOLUTION) ×1 IMPLANT

## 2023-08-14 NOTE — Anesthesia Postprocedure Evaluation (Signed)
 Anesthesia Post Note  Patient: Madeline Perkins  Procedure(s) Performed: LAPAROSCOPIC CHOLECYSTECTOMY     Patient location during evaluation: PACU Anesthesia Type: General Level of consciousness: awake and alert, oriented and patient cooperative Pain management: pain level controlled Vital Signs Assessment: post-procedure vital signs reviewed and stable Respiratory status: spontaneous breathing, nonlabored ventilation and respiratory function stable Cardiovascular status: blood pressure returned to baseline and stable Postop Assessment: no apparent nausea or vomiting Anesthetic complications: no   No notable events documented.  Last Vitals:  Vitals:   08/14/23 1030 08/14/23 1045  BP: 109/73 107/75  Pulse: 68 76  Resp: 11 14  Temp:    SpO2: 100% 100%    Last Pain:  Vitals:   08/14/23 1050  TempSrc:   PainSc: 4                  Jacquelyne Matte

## 2023-08-14 NOTE — H&P (Signed)
 Madeline Perkins is an 38 y.o. female.   Chief Complaint: Gallbladder polyp HPI: 38 year old female presents for laparoscopic cholecystectomy for gallbladder polyp.  This is likely due to chronic inflammation.  She has not had any further attacks since I saw her in the office a few weeks ago.  No new complaints.  Past Medical History:  Diagnosis Date   Cancer (HCC)    melanoma Stage 4 on lower back   GERD (gastroesophageal reflux disease)    Situational anxiety    Ulcerative colitis (HCC)     Past Surgical History:  Procedure Laterality Date   APPENDECTOMY     BREAST ENHANCEMENT SURGERY     BREAST SURGERY  2012   LAPAROSCOPIC APPENDECTOMY  05/03/2012   Procedure: APPENDECTOMY LAPAROSCOPIC;  Surgeon: Diantha Fossa, MD;  Location: [facility] OR;  Service: General;  Laterality: N/A;   MELANOMA EXCISION WITH SENTINEL LYMPH NODE BIOPSY N/A 08/10/2012   Procedure: RE-EXCISION BACK MELANOMA WITH SENTINEL LEFT INGUINAL LYMPH NODE BIOPSY;  Surgeon: Diantha Fossa, MD;  Location: [facility] OR;  Service: General;  Laterality: N/A;   WISDOM TOOTH EXTRACTION  02/2008    Family History  Problem Relation Age of Onset   Other Mother    Cancer Mother        lymphoma   Hypertension Mother    Liver cancer Mother    Hypertension Father    Diabetes Maternal Grandmother    Cancer Paternal Grandfather        esophageal   Social History:  reports that she has never smoked. She has never used smokeless tobacco. She reports that she does not currently use alcohol. She reports that she does not use drugs.  Allergies:  Allergies  Allergen Reactions   Penicillins Hives    Childhood allergy Has patient had a PCN reaction causing immediate rash, facial/tongue/throat swelling, SOB or lightheadedness with hypotension: Yes Has patient had a PCN reaction causing severe rash involving mucus membranes or skin necrosis: No Has patient had a PCN reaction that required hospitalization: No Has patient had a PCN reaction  occurring within the last 10 years: No If all of the above answers are "NO", then may proceed with Cephalosporin use.     Medications Prior to Admission  Medication Sig Dispense Refill   famotidine (PEPCID) 20 MG tablet Take 20 mg by mouth daily.     levonorgestrel  (MIRENA , 52 MG,) 20 MCG/DAY IUD 1 each by Intrauterine route once.     MAGNESIUM PO Take 1 tablet by mouth daily.     mesalamine  (LIALDA ) 1.2 g EC tablet Take 4 tablets (4.8 g total) by mouth daily. 120 tablet 6   OVER THE COUNTER MEDICATION Take 1 capsule by mouth daily. Believe Supplement     TURMERIC PO Take 1 tablet by mouth daily.     Vitamin D-Vitamin K (VITAMIN K2-VITAMIN D3 PO) Take 1 tablet by mouth daily.     albuterol  (VENTOLIN  HFA) 108 (90 Base) MCG/ACT inhaler Inhale 1-2 puffs into the lungs every 6 (six) hours as needed. (Patient not taking: Reported on 07/31/2023) 6.7 g 0   doxycycline  (VIBRA -TABS) 100 MG tablet Take 1 tablet (100 mg total) by mouth 2 (two) times daily. (Patient not taking: Reported on 07/31/2023) 20 tablet 0   fluticasone  (FLONASE ) 50 MCG/ACT nasal spray Place 2 sprays into both nostrils daily. (Patient not taking: Reported on 07/31/2023) 16 g 0   mesalamine  (LIALDA ) 1.2 g EC tablet Take 4 tablets (4.8 g total) by mouth  daily. (Patient not taking: Reported on 07/31/2023) 120 tablet 5   ondansetron  (ZOFRAN -ODT) 4 MG disintegrating tablet dissolve  2 tablets (8 mg total) in mouth 2 (two) times daily as needed. (Patient not taking: Reported on 07/31/2023) 120 tablet 0   oxybutynin  (DITROPAN -XL) 10 MG 24 hr tablet Take 1 tablet (10 mg total) by mouth daily. (Patient not taking: Reported on 07/31/2023) 90 tablet 3   pantoprazole  (PROTONIX ) 40 MG tablet Take 1 tablet (40 mg total) by mouth daily. (Patient not taking: Reported on 07/31/2023) 30 tablet 2   pantoprazole  (PROTONIX ) 40 MG tablet Take 1 tablet (40 mg total) by mouth 2 (two) times daily. (Patient not taking: Reported on 07/31/2023) 60 tablet 11    predniSONE  (STERAPRED UNI-PAK 21 TAB) 10 MG (21) TBPK tablet 6 day taper; take as directed on package instructions (Patient not taking: Reported on 07/31/2023) 21 tablet 0   promethazine -dextromethorphan (PROMETHAZINE -DM) 6.25-15 MG/5ML syrup Take 5 mLs by mouth 4 (four) times daily as needed. (Patient not taking: Reported on 07/31/2023) 118 mL 0   sucralfate  (CARAFATE ) 1 g tablet Take 1 tablet (1 g total) by mouth twice daily on an empty stomach. (Patient not taking: Reported on 07/31/2023) 60 tablet 1    Results for orders placed or performed during the hospital encounter of 08/14/23 (from the past 48 hours)  Pregnancy, urine POC     Status: None   Collection Time: 08/14/23  7:58 AM  Result Value Ref Range   Preg Test, Ur NEGATIVE NEGATIVE    Comment:        THE SENSITIVITY OF THIS METHODOLOGY IS >24 mIU/mL    No results found.  Review of Systems  Blood pressure 121/86, pulse 67, temperature 97.8 F (36.6 C), temperature source Oral, resp. rate 17, height 5\' 11"  (1.803 m), weight 81.6 kg, SpO2 100%, unknown if currently breastfeeding. Physical Exam HENT:     Head: Normocephalic.     Mouth/Throat:     Mouth: Mucous membranes are moist.  Cardiovascular:     Rate and Rhythm: Normal rate and regular rhythm.  Pulmonary:     Effort: Pulmonary effort is normal.     Breath sounds: Normal breath sounds. No wheezing.  Abdominal:     General: Abdomen is flat. There is no distension.     Palpations: Abdomen is soft.     Tenderness: There is no abdominal tenderness.  Musculoskeletal:        General: Normal range of motion.  Skin:    General: Skin is warm.  Neurological:     Mental Status: She is alert and oriented to person, place, and time.      Assessment/Plan Gallbladder polyp -for laparoscopic cholecystectomy.  Procedure, risks, and benefits were discussed in detail again with her.  Questions answered.  I discussed expected postoperative course.  She is agreeable.  Cloyce Darby, MD 08/14/2023, 8:24 AM

## 2023-08-14 NOTE — Anesthesia Procedure Notes (Signed)
 Procedure Name: Intubation Date/Time: 08/14/2023 8:50 AM  Performed by: Grier Leber, CRNAPre-anesthesia Checklist: Patient identified, Emergency Drugs available, Suction available and Patient being monitored Patient Re-evaluated:Patient Re-evaluated prior to induction Oxygen Delivery Method: Circle System Utilized Preoxygenation: Pre-oxygenation with 100% oxygen Induction Type: IV induction Ventilation: Mask ventilation without difficulty Laryngoscope Size: Mac and 3 Grade View: Grade I Tube type: Oral Tube size: 7.0 mm Number of attempts: 1 Airway Equipment and Method: Stylet Placement Confirmation: ETT inserted through vocal cords under direct vision, positive ETCO2 and breath sounds checked- equal and bilateral Secured at: 22 cm Tube secured with: Tape Dental Injury: Teeth and Oropharynx as per pre-operative assessment

## 2023-08-14 NOTE — Anesthesia Preprocedure Evaluation (Signed)
 Anesthesia Evaluation  Patient identified by MRN, date of birth, ID band Patient awake    Reviewed: Allergy & Precautions, H&P , NPO status , Patient's Chart, lab work & pertinent test results  Airway Mallampati: II  TM Distance: >3 FB Neck ROM: Full    Dental no notable dental hx.    Pulmonary neg pulmonary ROS   Pulmonary exam normal breath sounds clear to auscultation       Cardiovascular negative cardio ROS Normal cardiovascular exam Rhythm:Regular Rate:Normal     Neuro/Psych negative neurological ROS  negative psych ROS   GI/Hepatic Neg liver ROS, PUD,GERD  Controlled,,  Endo/Other  negative endocrine ROS    Renal/GU negative Renal ROS  negative genitourinary   Musculoskeletal negative musculoskeletal ROS (+)    Abdominal   Peds negative pediatric ROS (+)  Hematology negative hematology ROS (+)   Anesthesia Other Findings   Reproductive/Obstetrics negative OB ROS                             Anesthesia Physical Anesthesia Plan  ASA: 2  Anesthesia Plan: General   Post-op Pain Management: Tylenol  PO (pre-op)* and Toradol  IV (intra-op)*   Induction: Intravenous  PONV Risk Score and Plan: 4 or greater and Ondansetron , Dexamethasone , Midazolam  and Treatment may vary due to age or medical condition  Airway Management Planned: Oral ETT  Additional Equipment: None  Intra-op Plan:   Post-operative Plan: Extubation in OR  Informed Consent: I have reviewed the patients History and Physical, chart, labs and discussed the procedure including the risks, benefits and alternatives for the proposed anesthesia with the patient or authorized representative who has indicated his/her understanding and acceptance.     Dental advisory given  Plan Discussed with: CRNA  Anesthesia Plan Comments:        Anesthesia Quick Evaluation

## 2023-08-14 NOTE — Transfer of Care (Signed)
 Immediate Anesthesia Transfer of Care Note  Patient: Madeline Perkins  Procedure(s) Performed: LAPAROSCOPIC CHOLECYSTECTOMY  Patient Location: PACU  Anesthesia Type:General  Level of Consciousness: awake, alert , oriented, drowsy, and patient cooperative  Airway & Oxygen Therapy: Patient Spontanous Breathing and Patient connected to face mask oxygen  Post-op Assessment: Report given to RN and Post -op Vital signs reviewed and stable  Post vital signs: Reviewed and stable  Last Vitals:  Vitals Value Taken Time  BP 108/68 0944  Temp    Pulse 79 08/14/23 0943  Resp 11 08/14/23 0943  SpO2 100 % 08/14/23 0943  Vitals shown include unfiled device data.  Last Pain:  Vitals:   08/14/23 0737  TempSrc:   PainSc: 0-No pain         Complications: No notable events documented.

## 2023-08-14 NOTE — Op Note (Addendum)
  08/14/2023  9:44 AM  PATIENT:  Madeline Perkins  38 y.o. female  PRE-OPERATIVE DIAGNOSIS:  GALLBLADDER POLYP  POST-OPERATIVE DIAGNOSIS:  GALLBLADDER POLYP  PROCEDURE:  Procedure(s): LAPAROSCOPIC CHOLECYSTECTOMY  SURGEON:  Dorena Gander, MD  ASSISTANTS: Gaetana Jones, MD  ANESTHESIA:   local and general  EBL:  Total I/O In: 800 [I.V.:700; IV Piggyback:100] Out: 10 [Blood:10]  BLOOD ADMINISTERED:none  DRAINS: none   SPECIMEN:  Excision  DISPOSITION OF SPECIMEN:  PATHOLOGY  COUNTS:  YES  DICTATION: .Dragon Dictation Procedure in detail: Informed consent was obtained.  She received intravenous antibiotics.  She was brought to the operating room and general endotracheal anesthesia was administered by the anesthesia staff.  Her abdomen was prepped and draped in sterile fashion.  Timeout procedure was performed.The infraumbilical region was infiltrated with local. Infraumbilical incision was made. Subcutaneous tissues were dissected down revealing the anterior fascia. This was divided sharply along the midline. Peritoneal cavity was entered under direct vision without complication. A 0 Vicryl pursestring was placed around the fascial opening. Hassan trocar was inserted into the abdomen. The abdomen was insufflated with carbon dioxide in standard fashion. Under direct vision a 5 mm epigastric and 5 mm right abdominal port x 2 were placed.  Local was used at each port site.  Laparoscopic exploration revealed no significant intra-abdominal adhesions.  The gallbladder was identified.  It was retracted superiorly and medially.  The infundibulum was retracted inferiorly and laterally.  Dissection began laterally and progressed medially identifying the cystic duct.  Dissection continued until a critical view of safety was achieved.  3 clips were placed proximal in the cystic duct.  1 was placed distally and it was divided.  The cystic artery was then dissected out.  It was clipped twice  proximally, once distally and divided.  The gallbladder was taken off the liver bed using Bovie cautery.  It was placed in a bag and removed from the abdomen.  It was sent to pathology.  The liver bed was irrigated and hemostasis was ensured.  The clips are remained in good position.  Ports were removed under direct vision.  Pneumoperitoneum was released.  The infraumbilical fascia was closed by tying the pursestring.  The wounds were irrigated and closed with 4-0 Vicryl followed by Dermabond.  All counts were correct.  She tolerated the procedure without apparent complication was taken recovery room in stable condition.  PATIENT DISPOSITION:  PACU - hemodynamically stable.   Delay start of Pharmacological VTE agent (>24hrs) due to surgical blood loss or risk of bleeding:  no  Dorena Gander, MD, MPH, FACS Pager: [phone]  4/25/20259:44 AM

## 2023-08-15 ENCOUNTER — Encounter (HOSPITAL_COMMUNITY): Payer: Self-pay | Admitting: General Surgery

## 2023-08-17 LAB — SURGICAL PATHOLOGY

## 2023-09-09 NOTE — Progress Notes (Signed)
   PROVIDER:  DANN DALLAS HUMMER, MD  MRN: [id number] DOB: 04/15/1986 DATE OF ENCOUNTER: 09/09/2023 Interval History:     Status post laparoscopic cystectomy on 08/14/2023 for gallbladder polyp.  Her pathology report returned benign and I discussed this with her by phone when it resulted.  She is doing well.  She is back to work.  She is avoiding heavy lifting.  She is eating and moving her bowels without difficulty.    Physical Examination:   Physical Exam   Abdomen is soft and nontender.  All 4 wounds are well-healed without signs of infection.   Assessment and Plan:     Madeline Perkins is a 38 y.o. female who underwent laparoscopic cholecystectomy on 08/14/2023  Diagnoses and all orders for this visit:  S/P laparoscopic cholecystectomy     Return to full duty May 27.  Return as needed.    No follow-ups on file.   The plan was discussed in detail with the patient today, who expressed understanding.  The patient has my contact information, and understands to call me with any additional questions or concerns in the interval.  I would be happy to see the patient back sooner if the need arises.   DANN DALLAS HUMMER, MD

## 2023-10-26 ENCOUNTER — Encounter (HOSPITAL_COMMUNITY): Payer: Self-pay | Admitting: Anesthesiology

## 2023-11-12 ENCOUNTER — Ambulatory Visit (HOSPITAL_COMMUNITY): Admission: RE | Admit: 2023-11-12 | Source: Home / Self Care | Admitting: Obstetrics and Gynecology

## 2023-11-12 ENCOUNTER — Encounter (HOSPITAL_COMMUNITY): Admission: RE | Payer: Self-pay | Source: Home / Self Care

## 2023-11-12 SURGERY — CREATION, URETHRAL SLING, RETROPUBIC APPROACH, USING POLYPROPYLENE TAPE
Anesthesia: Choice

## 2024-01-19 ENCOUNTER — Emergency Department (HOSPITAL_COMMUNITY): Admission: EM | Admit: 2024-01-19 | Discharge: 2024-01-19 | Disposition: A | Discharge status: Home / Self Care

## 2024-01-19 ENCOUNTER — Other Ambulatory Visit: Payer: Self-pay

## 2024-01-19 ENCOUNTER — Emergency Department (HOSPITAL_COMMUNITY)

## 2024-01-19 DIAGNOSIS — M542 Cervicalgia: Secondary | ICD-10-CM | POA: Diagnosis not present

## 2024-01-19 DIAGNOSIS — Y92410 Unspecified street and highway as the place of occurrence of the external cause: Secondary | ICD-10-CM | POA: Insufficient documentation

## 2024-01-19 MED ORDER — METHOCARBAMOL 500 MG PO TABS: 500.0000 mg | ORAL_TABLET | Freq: Four times a day (QID) | ORAL | 0 refills | Status: AC | PRN
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
## 2024-01-19 NOTE — Discharge Instructions (Signed)
 You can alternate Tylenol  and Motrin  as needed for pain.  You can also take Robaxin up to 4 times a day as needed for muscle spasm or severe pain.
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
## 2024-02-04 ENCOUNTER — Other Ambulatory Visit: Payer: Self-pay | Admitting: Medical Genetics

## 2024-02-04 DIAGNOSIS — Z006 Encounter for examination for normal comparison and control in clinical research program: Secondary | ICD-10-CM

## 2024-03-10 DIAGNOSIS — L814 Other melanin hyperpigmentation: Secondary | ICD-10-CM | POA: Diagnosis not present

## 2024-03-10 DIAGNOSIS — Z85820 Personal history of malignant melanoma of skin: Secondary | ICD-10-CM | POA: Diagnosis not present

## 2024-03-10 DIAGNOSIS — D1801 Hemangioma of skin and subcutaneous tissue: Secondary | ICD-10-CM | POA: Diagnosis not present

## 2024-03-10 DIAGNOSIS — D225 Melanocytic nevi of trunk: Secondary | ICD-10-CM | POA: Diagnosis not present

## 2024-03-10 DIAGNOSIS — L821 Other seborrheic keratosis: Secondary | ICD-10-CM | POA: Diagnosis not present

## 2024-03-10 DIAGNOSIS — Z08 Encounter for follow-up examination after completed treatment for malignant neoplasm: Secondary | ICD-10-CM | POA: Diagnosis not present

## 2024-03-10 DIAGNOSIS — Z872 Personal history of diseases of the skin and subcutaneous tissue: Secondary | ICD-10-CM | POA: Diagnosis not present

## 2024-03-10 DIAGNOSIS — D485 Neoplasm of uncertain behavior of skin: Secondary | ICD-10-CM | POA: Diagnosis not present

## 2024-03-10 DIAGNOSIS — D2272 Melanocytic nevi of left lower limb, including hip: Secondary | ICD-10-CM | POA: Diagnosis not present

## 2024-04-01 DIAGNOSIS — K5190 Ulcerative colitis, unspecified, without complications: Secondary | ICD-10-CM | POA: Diagnosis not present
